# Patient Record
Sex: Female | Born: 1970 | Race: White | Hispanic: No | Smoking: Current every day smoker
Health system: Southern US, Community
[De-identification: ages and names within clinical notes are randomized; demographics above are authoritative.]

## PROBLEM LIST (undated history)

## (undated) DIAGNOSIS — R569 Unspecified convulsions: Secondary | ICD-10-CM

## (undated) DIAGNOSIS — I10 Essential (primary) hypertension: Secondary | ICD-10-CM

## (undated) DIAGNOSIS — Z8489 Family history of other specified conditions: Secondary | ICD-10-CM

## (undated) DIAGNOSIS — E119 Type 2 diabetes mellitus without complications: Secondary | ICD-10-CM

## (undated) DIAGNOSIS — G43909 Migraine, unspecified, not intractable, without status migrainosus: Secondary | ICD-10-CM

## (undated) DIAGNOSIS — F329 Major depressive disorder, single episode, unspecified: Secondary | ICD-10-CM

## (undated) DIAGNOSIS — F32A Depression, unspecified: Secondary | ICD-10-CM

## (undated) DIAGNOSIS — E039 Hypothyroidism, unspecified: Secondary | ICD-10-CM

## (undated) DIAGNOSIS — J410 Simple chronic bronchitis: Secondary | ICD-10-CM

## (undated) DIAGNOSIS — E079 Disorder of thyroid, unspecified: Secondary | ICD-10-CM

## (undated) HISTORY — PX: ABDOMINAL HYSTERECTOMY: SHX81

## (undated) HISTORY — PX: NOSE SURGERY: SHX723

---

## 2004-10-05 ENCOUNTER — Ambulatory Visit: Payer: Self-pay | Admitting: Family Medicine

## 2004-10-19 ENCOUNTER — Other Ambulatory Visit: Payer: Self-pay

## 2004-11-19 ENCOUNTER — Ambulatory Visit: Payer: Self-pay | Admitting: Unknown Physician Specialty

## 2005-01-07 ENCOUNTER — Ambulatory Visit: Payer: Self-pay | Admitting: Unknown Physician Specialty

## 2008-03-08 ENCOUNTER — Emergency Department: Payer: Self-pay | Admitting: Internal Medicine

## 2008-07-31 ENCOUNTER — Ambulatory Visit: Payer: Self-pay | Admitting: General Surgery

## 2008-08-06 ENCOUNTER — Ambulatory Visit: Payer: Self-pay | Admitting: General Surgery

## 2009-12-29 ENCOUNTER — Ambulatory Visit: Payer: Self-pay | Admitting: Family Medicine

## 2010-01-08 ENCOUNTER — Ambulatory Visit: Payer: Self-pay | Admitting: Family Medicine

## 2010-08-18 ENCOUNTER — Ambulatory Visit: Payer: Self-pay | Admitting: Family Medicine

## 2010-09-02 ENCOUNTER — Ambulatory Visit: Payer: Self-pay | Admitting: Family Medicine

## 2012-11-12 ENCOUNTER — Inpatient Hospital Stay: Payer: Self-pay | Admitting: Internal Medicine

## 2012-11-12 LAB — COMPREHENSIVE METABOLIC PANEL
Albumin: 3.7 g/dL (ref 3.4–5.0)
Alkaline Phosphatase: 98 U/L (ref 50–136)
Anion Gap: 6 — ABNORMAL LOW (ref 7–16)
BUN: 12 mg/dL (ref 7–18)
Bilirubin,Total: 0.5 mg/dL (ref 0.2–1.0)
Calcium, Total: 9.5 mg/dL (ref 8.5–10.1)
Chloride: 100 mmol/L (ref 98–107)
Co2: 27 mmol/L (ref 21–32)
Creatinine: 0.98 mg/dL (ref 0.60–1.30)
EGFR (African American): >60
EGFR (Non-African Amer.): >60
Glucose: 175 mg/dL — ABNORMAL HIGH (ref 65–99)
Osmolality: 270 (ref 275–301)
Potassium: 3.6 mmol/L (ref 3.5–5.1)
SGOT(AST): 21 U/L (ref 15–37)
SGPT (ALT): 27 U/L (ref 12–78)
Sodium: 133 mmol/L — ABNORMAL LOW (ref 136–145)
Total Protein: 8.4 g/dL — ABNORMAL HIGH (ref 6.4–8.2)

## 2012-11-12 LAB — URINALYSIS, COMPLETE
Bacteria: NONE SEEN
Bilirubin,UR: NEGATIVE
Blood: NEGATIVE
Glucose,UR: NEGATIVE mg/dL (ref 0–75)
Ketone: NEGATIVE
Leukocyte Esterase: NEGATIVE
Nitrite: NEGATIVE
Ph: 5 (ref 4.5–8.0)
Protein: 100
RBC,UR: <1 /HPF (ref 0–5)
Specific Gravity: 1.017 (ref 1.003–1.030)
Squamous Epithelial: 1
WBC UR: 2 /HPF (ref 0–5)

## 2012-11-12 LAB — CBC WITH DIFFERENTIAL/PLATELET
Basophil #: 0 10*3/uL (ref 0.0–0.1)
Basophil %: 0.2 %
Eosinophil #: 0 10*3/uL (ref 0.0–0.7)
Eosinophil %: 0 %
HCT: 46.5 % (ref 35.0–47.0)
HGB: 16.2 g/dL — ABNORMAL HIGH (ref 12.0–16.0)
Lymphocyte #: 0.5 10*3/uL — ABNORMAL LOW (ref 1.0–3.6)
Lymphocyte %: 2.6 %
MCH: 30.4 pg (ref 26.0–34.0)
MCHC: 34.9 g/dL (ref 32.0–36.0)
MCV: 87 fL (ref 80–100)
Monocyte #: 0.7 x10 3/mm (ref 0.2–0.9)
Monocyte %: 3.4 %
Neutrophil #: 18.2 10*3/uL — ABNORMAL HIGH (ref 1.4–6.5)
Neutrophil %: 93.8 %
Platelet: 205 10*3/uL (ref 150–440)
RBC: 5.34 10*6/uL — ABNORMAL HIGH (ref 3.80–5.20)
RDW: 13.6 % (ref 11.5–14.5)
WBC: 19.4 10*3/uL — ABNORMAL HIGH (ref 3.6–11.0)

## 2012-11-12 LAB — PREGNANCY, URINE: Pregnancy Test, Urine: NEGATIVE m[IU]/mL

## 2012-11-12 LAB — PROTIME-INR
INR: 1
Prothrombin Time: 13.5 secs (ref 11.5–14.7)

## 2012-11-12 LAB — RAPID INFLUENZA A&B ANTIGENS

## 2012-11-13 LAB — BASIC METABOLIC PANEL
Anion Gap: 0 — ABNORMAL LOW (ref 7–16)
BUN: 9 mg/dL (ref 7–18)
Calcium, Total: 8.3 mg/dL — ABNORMAL LOW (ref 8.5–10.1)
Chloride: 103 mmol/L (ref 98–107)
Co2: 32 mmol/L (ref 21–32)
Creatinine: 1.03 mg/dL (ref 0.60–1.30)
EGFR (African American): >60
EGFR (Non-African Amer.): >60
Glucose: 149 mg/dL — ABNORMAL HIGH (ref 65–99)
Osmolality: 272 (ref 275–301)
Potassium: 3.2 mmol/L — ABNORMAL LOW (ref 3.5–5.1)
Sodium: 135 mmol/L — ABNORMAL LOW (ref 136–145)

## 2012-11-13 LAB — CBC WITH DIFFERENTIAL/PLATELET
Basophil #: 0 10*3/uL (ref 0.0–0.1)
Basophil %: 0.2 %
Eosinophil #: 0 10*3/uL (ref 0.0–0.7)
Eosinophil %: 0 %
HCT: 41.2 % (ref 35.0–47.0)
HGB: 14.4 g/dL (ref 12.0–16.0)
Lymphocyte #: 0.5 10*3/uL — ABNORMAL LOW (ref 1.0–3.6)
Lymphocyte %: 3.6 %
MCH: 30.9 pg (ref 26.0–34.0)
MCHC: 35 g/dL (ref 32.0–36.0)
MCV: 88 fL (ref 80–100)
Monocyte #: 0.5 x10 3/mm (ref 0.2–0.9)
Monocyte %: 3.3 %
Neutrophil #: 14 10*3/uL — ABNORMAL HIGH (ref 1.4–6.5)
Neutrophil %: 92.9 %
Platelet: 172 10*3/uL (ref 150–440)
RBC: 4.66 10*6/uL (ref 3.80–5.20)
RDW: 13.6 % (ref 11.5–14.5)
WBC: 15.1 10*3/uL — ABNORMAL HIGH (ref 3.6–11.0)

## 2012-11-13 LAB — URINE CULTURE

## 2012-11-13 LAB — LIPID PANEL
Cholesterol: 127 mg/dL (ref 0–200)
HDL Cholesterol: 38 mg/dL — ABNORMAL LOW (ref 40–60)
Ldl Cholesterol, Calc: 70 mg/dL (ref 0–100)
Triglycerides: 95 mg/dL (ref 0–200)
VLDL Cholesterol, Calc: 19 mg/dL (ref 5–40)

## 2012-11-13 LAB — HEMOGLOBIN A1C: Hemoglobin A1C: 6.3 % (ref 4.2–6.3)

## 2012-11-13 LAB — MAGNESIUM: Magnesium: 1.3 mg/dL — ABNORMAL LOW

## 2012-11-13 LAB — TSH: Thyroid Stimulating Horm: 0.552 u[IU]/mL

## 2012-11-14 LAB — CBC WITH DIFFERENTIAL/PLATELET
Basophil #: 0 10*3/uL (ref 0.0–0.1)
Basophil %: 0.3 %
Eosinophil #: 0 10*3/uL (ref 0.0–0.7)
Eosinophil %: 0.1 %
HCT: 40.8 % (ref 35.0–47.0)
HGB: 14 g/dL (ref 12.0–16.0)
Lymphocyte #: 1.1 10*3/uL (ref 1.0–3.6)
Lymphocyte %: 7.2 %
MCH: 30.7 pg (ref 26.0–34.0)
MCHC: 34.4 g/dL (ref 32.0–36.0)
MCV: 89 fL (ref 80–100)
Monocyte #: 0.7 x10 3/mm (ref 0.2–0.9)
Monocyte %: 4.7 %
Neutrophil #: 13.9 10*3/uL — ABNORMAL HIGH (ref 1.4–6.5)
Neutrophil %: 87.7 %
Platelet: 175 10*3/uL (ref 150–440)
RBC: 4.57 10*6/uL (ref 3.80–5.20)
RDW: 13.8 % (ref 11.5–14.5)
WBC: 15.8 10*3/uL — ABNORMAL HIGH (ref 3.6–11.0)

## 2012-11-14 LAB — BASIC METABOLIC PANEL
Anion Gap: 0 — ABNORMAL LOW (ref 7–16)
BUN: 9 mg/dL (ref 7–18)
Calcium, Total: 8.5 mg/dL (ref 8.5–10.1)
Chloride: 105 mmol/L (ref 98–107)
Co2: 32 mmol/L (ref 21–32)
Creatinine: 1.19 mg/dL (ref 0.60–1.30)
EGFR (African American): >60
EGFR (Non-African Amer.): 56 — ABNORMAL LOW
Glucose: 122 mg/dL — ABNORMAL HIGH (ref 65–99)
Osmolality: 274 (ref 275–301)
Potassium: 4.3 mmol/L (ref 3.5–5.1)
Sodium: 137 mmol/L (ref 136–145)

## 2012-11-14 LAB — VANCOMYCIN, TROUGH: Vancomycin, Trough: 10 ug/mL (ref 10–20)

## 2012-11-14 LAB — MAGNESIUM: Magnesium: 1.8 mg/dL

## 2012-11-16 LAB — BASIC METABOLIC PANEL
Anion Gap: 4 — ABNORMAL LOW (ref 7–16)
BUN: 9 mg/dL (ref 7–18)
Calcium, Total: 8.6 mg/dL (ref 8.5–10.1)
Chloride: 107 mmol/L (ref 98–107)
Co2: 28 mmol/L (ref 21–32)
Creatinine: 0.8 mg/dL (ref 0.60–1.30)
EGFR (African American): >60
EGFR (Non-African Amer.): >60
Glucose: 133 mg/dL — ABNORMAL HIGH (ref 65–99)
Osmolality: 278 (ref 275–301)
Potassium: 3.7 mmol/L (ref 3.5–5.1)
Sodium: 139 mmol/L (ref 136–145)

## 2012-11-16 LAB — CBC WITH DIFFERENTIAL/PLATELET
Basophil #: 0 10*3/uL (ref 0.0–0.1)
Basophil %: 0.3 %
Eosinophil #: 0 10*3/uL (ref 0.0–0.7)
Eosinophil %: 0.1 %
HCT: 35.4 % (ref 35.0–47.0)
HGB: 12.2 g/dL (ref 12.0–16.0)
Lymphocyte #: 1 10*3/uL (ref 1.0–3.6)
Lymphocyte %: 12.8 %
MCH: 30.5 pg (ref 26.0–34.0)
MCHC: 34.6 g/dL (ref 32.0–36.0)
MCV: 88 fL (ref 80–100)
Monocyte #: 0.6 x10 3/mm (ref 0.2–0.9)
Monocyte %: 8.1 %
Neutrophil #: 5.9 10*3/uL (ref 1.4–6.5)
Neutrophil %: 78.7 %
Platelet: 198 10*3/uL (ref 150–440)
RBC: 4.01 10*6/uL (ref 3.80–5.20)
RDW: 14 % (ref 11.5–14.5)
WBC: 7.5 10*3/uL (ref 3.6–11.0)

## 2012-11-17 LAB — CBC WITH DIFFERENTIAL/PLATELET
Basophil #: 0 10*3/uL (ref 0.0–0.1)
Basophil %: 0.6 %
Eosinophil #: 0 10*3/uL (ref 0.0–0.7)
Eosinophil %: 0.2 %
HCT: 37.1 % (ref 35.0–47.0)
HGB: 12.7 g/dL (ref 12.0–16.0)
Lymphocyte #: 1.2 10*3/uL (ref 1.0–3.6)
Lymphocyte %: 15.6 %
MCH: 30.4 pg (ref 26.0–34.0)
MCHC: 34.3 g/dL (ref 32.0–36.0)
MCV: 89 fL (ref 80–100)
Monocyte #: 0.7 x10 3/mm (ref 0.2–0.9)
Monocyte %: 9.7 %
Neutrophil #: 5.7 10*3/uL (ref 1.4–6.5)
Neutrophil %: 73.9 %
Platelet: 254 10*3/uL (ref 150–440)
RBC: 4.18 10*6/uL (ref 3.80–5.20)
RDW: 13.9 % (ref 11.5–14.5)
WBC: 7.7 10*3/uL (ref 3.6–11.0)

## 2012-11-17 LAB — BASIC METABOLIC PANEL
Anion Gap: 4 — ABNORMAL LOW (ref 7–16)
BUN: 10 mg/dL (ref 7–18)
Calcium, Total: 8.3 mg/dL — ABNORMAL LOW (ref 8.5–10.1)
Chloride: 105 mmol/L (ref 98–107)
Co2: 29 mmol/L (ref 21–32)
Creatinine: 0.91 mg/dL (ref 0.60–1.30)
EGFR (African American): >60
EGFR (Non-African Amer.): >60
Glucose: 118 mg/dL — ABNORMAL HIGH (ref 65–99)
Osmolality: 276 (ref 275–301)
Potassium: 4.1 mmol/L (ref 3.5–5.1)
Sodium: 138 mmol/L (ref 136–145)

## 2012-11-17 LAB — CULTURE, BLOOD (SINGLE)

## 2012-11-19 LAB — BASIC METABOLIC PANEL
Anion Gap: 3 — ABNORMAL LOW (ref 7–16)
BUN: 12 mg/dL (ref 7–18)
Calcium, Total: 8.7 mg/dL (ref 8.5–10.1)
Chloride: 107 mmol/L (ref 98–107)
Co2: 28 mmol/L (ref 21–32)
Creatinine: 0.89 mg/dL (ref 0.60–1.30)
EGFR (African American): >60
EGFR (Non-African Amer.): >60
Glucose: 149 mg/dL — ABNORMAL HIGH (ref 65–99)
Osmolality: 278 (ref 275–301)
Potassium: 4 mmol/L (ref 3.5–5.1)
Sodium: 138 mmol/L (ref 136–145)

## 2012-11-19 LAB — CBC WITH DIFFERENTIAL/PLATELET
Basophil #: 0.1 10*3/uL (ref 0.0–0.1)
Basophil %: 0.7 %
Eosinophil #: 0 10*3/uL (ref 0.0–0.7)
Eosinophil %: 0.1 %
HCT: 32.4 % — ABNORMAL LOW (ref 35.0–47.0)
HGB: 11.3 g/dL — ABNORMAL LOW (ref 12.0–16.0)
Lymphocyte #: 1 10*3/uL (ref 1.0–3.6)
Lymphocyte %: 10.8 %
MCH: 30.9 pg (ref 26.0–34.0)
MCHC: 35 g/dL (ref 32.0–36.0)
MCV: 88 fL (ref 80–100)
Monocyte #: 0.6 x10 3/mm (ref 0.2–0.9)
Monocyte %: 5.8 %
Neutrophil #: 7.8 10*3/uL — ABNORMAL HIGH (ref 1.4–6.5)
Neutrophil %: 82.6 %
Platelet: 292 10*3/uL (ref 150–440)
RBC: 3.67 10*6/uL — ABNORMAL LOW (ref 3.80–5.20)
RDW: 13.8 % (ref 11.5–14.5)
WBC: 9.5 10*3/uL (ref 3.6–11.0)

## 2012-11-19 LAB — CULTURE, BLOOD (SINGLE)

## 2012-11-22 ENCOUNTER — Inpatient Hospital Stay: Payer: Self-pay | Admitting: Internal Medicine

## 2012-11-22 LAB — CBC
HCT: 38.4 % (ref 35.0–47.0)
HGB: 13 g/dL (ref 12.0–16.0)
MCH: 30 pg (ref 26.0–34.0)
MCHC: 33.9 g/dL (ref 32.0–36.0)
MCV: 88 fL (ref 80–100)
Platelet: 417 10*3/uL (ref 150–440)
RBC: 4.34 10*6/uL (ref 3.80–5.20)
RDW: 14 % (ref 11.5–14.5)
WBC: 10.3 10*3/uL (ref 3.6–11.0)

## 2012-11-22 LAB — COMPREHENSIVE METABOLIC PANEL
Albumin: 2.6 g/dL — ABNORMAL LOW (ref 3.4–5.0)
Alkaline Phosphatase: 79 U/L (ref 50–136)
Anion Gap: 3 — ABNORMAL LOW (ref 7–16)
BUN: 15 mg/dL (ref 7–18)
Bilirubin,Total: 0.3 mg/dL (ref 0.2–1.0)
Calcium, Total: 9.4 mg/dL (ref 8.5–10.1)
Chloride: 106 mmol/L (ref 98–107)
Co2: 28 mmol/L (ref 21–32)
Creatinine: 0.9 mg/dL (ref 0.60–1.30)
EGFR (African American): >60
EGFR (Non-African Amer.): >60
Glucose: 144 mg/dL — ABNORMAL HIGH (ref 65–99)
Osmolality: 277 (ref 275–301)
Potassium: 4.1 mmol/L (ref 3.5–5.1)
SGOT(AST): 27 U/L (ref 15–37)
SGPT (ALT): 34 U/L (ref 12–78)
Sodium: 137 mmol/L (ref 136–145)
Total Protein: 7.9 g/dL (ref 6.4–8.2)

## 2012-11-22 LAB — HEMOGLOBIN A1C: Hemoglobin A1C: 6.8 % — ABNORMAL HIGH (ref 4.2–6.3)

## 2012-11-23 LAB — COMPREHENSIVE METABOLIC PANEL
Albumin: 2.3 g/dL — ABNORMAL LOW (ref 3.4–5.0)
Alkaline Phosphatase: 81 U/L (ref 50–136)
Anion Gap: 5 — ABNORMAL LOW (ref 7–16)
BUN: 13 mg/dL (ref 7–18)
Bilirubin,Total: 0.5 mg/dL (ref 0.2–1.0)
Calcium, Total: 9.2 mg/dL (ref 8.5–10.1)
Chloride: 103 mmol/L (ref 98–107)
Co2: 31 mmol/L (ref 21–32)
Creatinine: 1.03 mg/dL (ref 0.60–1.30)
EGFR (African American): >60
EGFR (Non-African Amer.): >60
Glucose: 124 mg/dL — ABNORMAL HIGH (ref 65–99)
Osmolality: 279 (ref 275–301)
Potassium: 4 mmol/L (ref 3.5–5.1)
SGOT(AST): 16 U/L (ref 15–37)
SGPT (ALT): 31 U/L (ref 12–78)
Sodium: 139 mmol/L (ref 136–145)
Total Protein: 7.4 g/dL (ref 6.4–8.2)

## 2012-11-23 LAB — CBC WITH DIFFERENTIAL/PLATELET
Basophil #: 0 10*3/uL (ref 0.0–0.1)
Basophil %: 0.3 %
Eosinophil #: 0 10*3/uL (ref 0.0–0.7)
Eosinophil %: 0.1 %
HCT: 33.5 % — ABNORMAL LOW (ref 35.0–47.0)
HGB: 11.5 g/dL — ABNORMAL LOW (ref 12.0–16.0)
Lymphocyte #: 1.4 10*3/uL (ref 1.0–3.6)
Lymphocyte %: 14.2 %
MCH: 30.5 pg (ref 26.0–34.0)
MCHC: 34.3 g/dL (ref 32.0–36.0)
MCV: 89 fL (ref 80–100)
Monocyte #: 0.7 x10 3/mm (ref 0.2–0.9)
Monocyte %: 7.1 %
Neutrophil #: 7.9 10*3/uL — ABNORMAL HIGH (ref 1.4–6.5)
Neutrophil %: 78.3 %
Platelet: 434 10*3/uL (ref 150–440)
RBC: 3.77 10*6/uL — ABNORMAL LOW (ref 3.80–5.20)
RDW: 13.6 % (ref 11.5–14.5)
WBC: 10.2 10*3/uL (ref 3.6–11.0)

## 2012-11-24 LAB — VANCOMYCIN, TROUGH: Vancomycin, Trough: 12 ug/mL (ref 10–20)

## 2012-11-26 LAB — WOUND AEROBIC CULTURE

## 2012-11-27 LAB — CULTURE, BLOOD (SINGLE)

## 2012-11-27 LAB — PLATELET COUNT: Platelet: 471 10*3/uL — ABNORMAL HIGH (ref 150–440)

## 2012-11-27 LAB — CREATININE, SERUM
Creatinine: 1.11 mg/dL (ref 0.60–1.30)
EGFR (African American): >60
EGFR (Non-African Amer.): >60

## 2014-05-03 NOTE — Discharge Summary (Signed)
PATIENT NAME:  Wendy [name], Wendy [name] MR#:  [id number] DATE OF BIRTH:  1970-04-17  DATE OF ADMISSION:  11/22/2012 DATE OF DISCHARGE:  11/28/2012  ADMITTING PHYSICIAN:  Theodoro Grist, MD    DISCHARGING PHYSICIAN:  Gladstone Lighter, MD  PRIMARY CARE PHYSICIAN: Dr. Jimmye Norman at [facility].   [place]: Surgical consultation with Dr. Burt Knack and Dr. Pat Patrick.   DISCHARGE DIAGNOSES: 1.  Right leg cellulitis with posterior leg eschar formation.  2.  Hypertension:  3.  Hypothyroidism.   4.  Depression.    DISCHARGE HOME MEDICATIONS:  1.  Effexor 75 mg p.o. daily.  2.  Levothyroxine 88 mcg p.o. daily.  3.  B complex 1 tablet p.o. daily.  4.  Vitamin D3 2000 international units p.o. daily.  5.  Lactobacillus capsule 1 capsule p.o. daily.  6.  HCTZ 25 mg p.o. daily.  7.  Percocet 10/325 mg 1 tablet p.o. q.6 hours p.r.n. for pain.  8.  Topical bacitracin applied topically to affected area b.i.d.   9.  Collagenase 250 units per gram topical ointment, apply topically to affected area once a day.  11.  Clindamycin 300 mg 1 capsule p.o. 3 times a day for 8 more days.   DISCHARGE DIET: Low-sodium diet.   DISCHARGE ACTIVITY: As tolerated.    FOLLOWUP INSTRUCTIONS: 1.  Advised to keep the right leg elevated for most of the time.  2.  Follow up with [facility] in 1 week and replace dressing as necessary. Please pace a nonadherent dressing to the right calf and change it every day or as needed.  3.  PCP followup in 2 weeks.   LABORATORY AND IMAGING STUDIES PRIOR TO DISCHARGE:  1.  WBC 10.2, hemoglobin 11.5, hematocrit 33.5, platelet count 434.  2.  Sodium 139, potassium 4.0, chloride 103, bicarb 31, BUN 13, creatinine 1.03, glucose 124 and calcium of 9.2.  3.  ALT 31, AST 16, alk phos 81, total bili 0.5, albumin of 3.3.  4.  Ultrasound Doppler of the right leg showing negative for DVT, prominent right inguinal lymph nodes noted likely from likely her right leg cellulitis.   5.  Blood cultures on admission negative. Coagulase-negative staph growing from the wound cultures taken from her right leg.   BRIEF HOSPITAL COURSE: [name] is a 44 year old obese Caucasian female with past medical history significant for hypertension and hypothyroidism, who had a recent hospital course from 11/12/2012 through 11/20/2012 for right leg cellulitis with eschar formation. On November 10th, patient was discharged because she had some personal issues to attend to and wanted to go home on oral antibiotics. She was discharged on Augmentin and doxycycline; however, came back on 11/22/2012 due to nonimproving cellulitis with oral antibiotics.  1.  Right leg cellulitis going on for almost 2 weeks now, initially admitted on November 2nd and  discharged on November 10th on oral antibiotics and comes back again within 2 days with  failure to antibiotics. She was seen by surgery this admission. Her swelling has improved a lot. She was advised to keep her leg elevated. She was started on vanc and Zosyn while in the hospital. Wound cultures were growing negative. She has sloughing of skin with black eschar on the back of the right leg with improved erythema on the front. Surgery advised to follow up as an outpatient in 1 week and see if they could do a superficial debridement.  For now, antibiotics were advised so she is being discharged on clindamycin as  SHE HAS SULFA ALLERGY. She will follow up with surgery as an outpatient. Dressing changes were explained to the patient at the time of discharge. She is also advised to use collagenase for skin healing and also bacitracin antiseptic for her wound topically. She is on Percocet for pain as needed.  2.  Hypothyroidism. She is on Synthroid.  3.  Depression. Continue Effexor.  4.  Hypertension. Continue home medications. The patient on HCTZ at home.  4.  Her course has been otherwise uneventful in the hospital.   DISCHARGE CONDITION: Stable.    DISCHARGE DISPOSITION: Home.   TIME SPENT ON DISCHARGE: 40 minutes.   ____________________________ Gladstone Lighter, MD rk:cs D: 11/28/2012 14:09:00 ET T: 11/28/2012 15:49:48 ET JOB#: [id number]  cc: Gladstone Lighter, MD, <Dictator> Myrle Sheng. Jimmye Norman, MD Gladstone Lighter MD ELECTRONICALLY SIGNED 12/05/2012 18:16

## 2014-05-03 NOTE — Consult Note (Signed)
Brief Consult Note: Diagnosis: RLE resolving cellulitis.   Patient was seen by consultant.   Consult note dictated.   Recommend further assessment or treatment.   Orders entered.   Comments: resolving/improving cellulitis without surgical indications for I&D etc. Rec silvadene BID and continueIV abx will follow.  Electronic Signatures: Lattie Hawooper, Terran Hollenkamp E (MD)  (Signed [phone]) [id number]-Nov-14 21:10)  Authored: Brief Consult Note   Last Updated: 12-Nov-14 21:10 by Lattie Hawooper, Gaynor Ferreras E (MD)

## 2014-05-03 NOTE — Consult Note (Signed)
PATIENT NAME:  Wendy [name], [name] MR#:  [id number] DATE OF BIRTH:  [birth year]  DATE OF CONSULTATION:  11/13/2012  CONSULTING PHYSICIAN:  Cristal Deerhristopher A. Romuald Mccaslin, MD  REASON FOR CONSULTATION: Fevers, right leg erythema, and tenderness.   HISTORY OF PRESENT ILLNESS: Wendy [name] is a pleasant 44 year old female with history of tobacco use and diabetes who presented to the ED with fevers to 104. She said that over the past previous days, she was having a migraine headache and decided to take her temperature and it was 104. At this time, her mother, who is here with her, noticed that she had redness around her ankle and she had some redness, which has extended partially around her leg. She also was tachycardic at that time and nauseated and does still continue to have the headache. No chills, night sweats, shortness of breath, cough, chest pain, abdominal pain, nausea, diarrhea, constipation, dysuria or hematuria.   PAST MEDICAL HISTORY: Hypertension, diabetes, anxiety, history of hysterectomy.   FAMILY HISTORY: Parents with DVT, history of cancer, history of hypertension, history of coronary artery disease, history of diabetes.   SOCIAL HISTORY: Smokes 1 to 2 packs a day since 44 years old. Denies alcohol or drugs.   REVIEW OF SYSTEMS: A 12-point review of systems was obtained. Pertinent positives and negatives as above.   PHYSICAL EXAMINATION:  VITAL SIGNS: Temperature 103, pulse 118, blood pressure 108/61, respirations 18, and is on nasal cannula.  GENERAL: No acute distress. Alert and oriented x3.  HEAD: Normocephalic, atraumatic.  EYES: No scleral icterus. No conjunctivitis.  FACE: No obvious facial trauma. Normal external nose. Normal external ears.  CHEST: Lungs clear to auscultation. Moving air well.  HEART: Regular rate and rhythm. No murmurs, rubs, or gallops.  ABDOMEN: Soft, nontender, nondistended.  EXTREMITIES: Right lower extremity with medial cellulitis with no obvious drainable  fluid collection or abscess. No obvious skin changes. Minimal edema. Also has two punctate areas on her medial thigh which are red as well but not with edema. Otherwise moves all extremities well.  NEUROLOGIC: Cranial nerves II through XII grossly intact. Sensation intact in all 4 extremities.   LABORATORY AND RADIOLOGICAL DATA: Significant for current white blood cell count of 15.1. was 19.4 at admission, 93% neutrophils. Hemoglobin, hematocrit, and platelets are normal. BMP is significant for potassium of 3.2, magnesium of 1.3. LFTs are normal.   IMAGING: Ultrasound left lower extremity shows no DVT, no obvious drainable fluid collection.   ASSESSMENT AND PLAN: Wendy [name] is a pleasant 44 year old female with right lower-extremity cellulitis. I see no obvious indications for surgery. No drainable fluid collections, not consistent with necrotizing soft-tissue infection. White cell count is improving. We will obtain a plain film x-ray to ensure no subcutaneous emphysema, but this, I think, would be very unlikely based on this presentation. Would continue IV antibiotics and continue to assess for possible abscess formation. I have discussed this with the patient and she agrees with the plan.    ____________________________ Si Raiderhristopher A. Tally Mattox, MD cal:np D: 11/13/2012 17:05:00 ET T: 11/13/2012 18:08:53 ET JOB#: [id number]  cc: Cristal Deerhristopher A. Zharia Conrow, MD, <Dictator> Jarvis NewcomerHRISTOPHER A Tirsa Gail MD ELECTRONICALLY SIGNED 11/15/2012 15:20

## 2014-05-03 NOTE — Consult Note (Signed)
PATIENT NAME:  Wendy [name], Wendy [name] MR#:  [id number] DATE OF BIRTH:  1970/03/29  DATE OF CONSULTATION:  11/22/2012  CONSMerleen NicelyULTING PHYSICIAN:  Adah Salvageichard E. Excell Seltzerooper, MD  CHIEF COMPLAINT: Right lower extremity cellulitis.   HISTORY OF PRESENT ILLNESS: This is [name] patient who was just discharged from the hospital after [name] 2 week stay 3 days ago. She was on IV antibiotics in the hospital and was sent home on oral antibiotics. She returns with worsening pain, low-grade fever and increasing redness of the right lower extremity. She had [name] diagnosis of cellulitis.   Of note, she was seen by Dr. Juliann PulseLundquist in consultation for surgery on the 3rd of November. See his note as well.   The patient states that she feels much better today. She has been on antibiotics now for [name] couple of doses. She can move her ankle and her toes without difficulty, and her pain is much improved.   PAST MEDICAL HISTORY: Gestational diabetes, hypertension, morbid obesity and tobacco abuse.   PAST SURGICAL HISTORY: Hysterectomy.   SOCIAL HISTORY: The patient stopped smoking 2 weeks ago. Does not drink alcohol.   FAMILY HISTORY: Noncontributory.   REVIEW OF SYSTEMS:  Ten-system review has been performed and negative with the exception of that mentioned in the HPI.   PHYSICAL EXAMINATION: VITAL SIGNS: Temp of 99.5. Morbidly obese with [name] BMI of 47. Pulse of 82, respirations 20, blood pressure 133/65.  HEENT: Shows morbid obesity.  NECK: No palpable nodes.  CHEST: Clear to auscultation.  CARDIAC: Regular rate and rhythm.  ABDOMEN: Soft, nontender, but obese.  EXTREMITIES: Obese and edematous. The right lower extremity is erythematous, but the erythema is fading, and with respect to prior ink markings from the prior hospitalization, the erythema has decreased. There are no active bullae. There is superficial exfoliation of the skin, or desquamation, and full range of motion with edema.  NEUROLOGIC: Grossly intact.   LABORATORY VALUES:  Demonstrate [name] normal white blood cell count.   An ultrasound shows no sign of DVT.   ASSESSMENT AND PLAN: This is [name] patient with resolving cellulitis. Her pain is improved. She is afebrile with [name] normal white blood cell count. I would continue IV antibiotics. There is no sign of necrotizing soft tissue infection in this patient. I will be happy to follow the patient while she is in the hospital. I have taken the liberty of ordering q.12 Silvadene dressing to help the desquamation.    ____________________________ Adah Salvageichard E. Excell Seltzerooper, MD rec:dmm D: 11/22/2012 21:23:18 ET T: 11/22/2012 21:39:15 ET JOB#: [id number]  cc: Adah Salvageichard E. Excell Seltzerooper, MD, <Dictator> Lattie HawICHARD E Harkirat Orozco MD ELECTRONICALLY SIGNED 11/23/2012 6:49

## 2014-05-03 NOTE — H&P (Signed)
PATIENT NAME:  Wendy [name], Wendy [name] MR#:  [id number] DATE OF BIRTH:  [birth year]  DATE OF ADMISSION:  11/12/2012  PRIMARY CARE PHYSICIAN:  Dr. Ulanda EdisonEmma Williams   REFERRING PHYSICIAN: Dr. Mayford KnifeWilliams  CHIEF COMPLAINT:  Fever, chills, right leg redness and tenderness since yesterday.   HISTORY OF PRESENT ILLNESS: [name] 44 year old Caucasian female with [name] history of hypertension, diabetes, tobacco abuse, anxiety. Presented to the ED with above complains. The patient is alert, awake, oriented, in no acute distress. The patient started to have [name] fever and chills since yesterday. Noticed had right leg tenderness and redness. In addition, patient had [name] fever, with [name] temperature of 104. Tachycardia, 120. The patient feels sick, with nausea, but denies any headache or dizziness. No cough, sputum, shortness of breath. No chest pain, palpitations.   PAST MEDICAL HISTORY: Hypertension, diabetes, anxiety, obesity.   SURGICAL HISTORY: Hysterectomy.   SOCIAL HISTORY: Smokes 1-1/2 packs [name] day since 44 years old. Denies any alcohol drinking or illicit drugs.   FAMILY HISTORY: Both parents had DVT.  REVIEW OF SYSTEMS: CONSTITUTIONAL: The patient has fever, chills, but no headache or dizziness. Has weakness and [name] decreased appetite.  EYES: No double vision or blurry vision.   ENT: No postnasal drip, slurred speech, or dysphagia.  CARDIOVASCULAR: No chest pain, palpitation, orthopnea, or nocturnal dyspnea. No leg edema.  PULMONARY: No cough, sputum, shortness of breath or hemoptysis.  GASTROINTESTINAL: No abdominal pain, nausea, vomiting or diarrhea. No melena or bloody stools.  GENITOURINARY: No dysuria, hematuria, or incontinence.  SKIN: No rash or jaundice.  NEUROLOGIC: No syncope, loss of consciousness, or seizure.  ENDOCRINE: No polyuria, polydipsia, heat or cold intolerance.  HEMATOLOGIC: No easy bruising or bleeding.   VITAL SIGNS: Temperature 102.6, blood pressure 140/62, pulse 110, respirations 20, O2  saturation 94% in room air.   PHYSICAL EXAMINATION:  GENERAL: The patient is alert, awake, oriented, in no acute distress.  HEENT: Pupils round, equal, react to light and accommodation. No discharge from ears or nose. Dry oral mucosa. Clear oropharynx. Facial redness.  NECK: Supple. No JVD or carotid bruits. No lymphadenopathy. No thyromegaly.  CARDIOVASCULAR: S1, S2. Regular rate and rhythm. No murmurs or gallop. Tachycardia.  PULMONARY: Bilateral air entry. No wheezing or rales. No use of accessory muscles to breathe.  ABDOMEN: Soft. No distention or tenderness. No organomegaly. Bowel sounds present. Obesity.  EXTREMITIES: No edema, clubbing or cyanosis. No calf tenderness, but has erythema and tenderness on the right lower extremity under knee down to right ankle.  NEUROLOGIC: Alert and oriented x 3. No focal deficit. Power 5/5. Sensation intact.   LABORATORY DATA:  Duplex of the right leg did not show any DVT. Urinalysis is negative. Pregnancy test negative. Influenza [name] and B negative. WBC 19.4, hemoglobin 16.2, platelets 205. Glucose 175, BUN 12, creatinine 0.98, sodium 133, potassium 3.6, chloride 100. INR 1.0. Chest x-ray: No acute cardiopulmonary disease.   IMPRESSIONS: 1.  Sepsis due to right leg cellulitis.  2.  Right lower extremity cellulitis.  3.  Hyponatremia.  4.  Hypertension.  5.  Diabetes.  6.  Obesity.  7.  Tobacco abuse.   PLAN OF TREATMENT:   1.  The patient will be admitted to [name] medical floor. We will start vancomycin and Zosyn, and follow up blood culture, CBC.  2.  For hyponatremia, will start normal saline IV and follow up BMP.  3.  For diabetes, we will start sliding scale, check hemoglobin A1c.   4.  For hypertension, we  will hold HCTZ due to hyponatremia and dehydration. Follow blood pressure.  5.  Smoking cessation, was counseled for 5 minutes.   Patient status is FULL CODE.  TIME SPENT: About 52 minutes.   I discussed the patient's condition and plan of  treatment with the patient and the patient's mother.     ____________________________ Shaune Pollack, MD qc:mr D: 11/12/2012 16:35:00 ET T: 11/12/2012 18:29:31 ET JOB#: [id number]  cc: Shaune Pollack, MD, <Dictator> Shaune Pollack MD ELECTRONICALLY SIGNED 11/14/2012 11:32

## 2014-05-03 NOTE — Discharge Summary (Signed)
PATIENT NAME:  Wendy [name], Cedra A MR#:  [id number] DATE OF BIRTH:  September 16, 1970  DATE OF ADMISSION:  11/12/2012 DATE OF DISCHARGE:  11/20/2012  DISCHARGE DIAGNOSES: 1.  Right lower extremity cellulitis.  2.  Hyponatremia.  3.  Diabetes mellitus.   4.  Hypertension.  5.  Tobacco abuse.  6.  Obesity.   CONSULTANTS: Dr. Juliann PulseLundquist with surgery.   IMAGING STUDIES: Include:  1.  A tibia and fibula right x-ray showed no acute abnormalities.  2. MRI of the right lower extremity showed soft tissue changes, no osteomyelitis, no abscess or air.   ADMITTING HISTORY AND PHYSICAL: Please see detailed H and P dictated previously. In brief, a 44 year old Caucasian female patient with history of diabetes, hypertension and tobacco abuse presented to the hospital with complaints of fever, chills, right lower extremity tenderness and redness. The patient was found to have temperature of 104, tachycardia 120 and with right lower extremity cellulitis, sepsis was admitted to the hospitalist service. Sepsis was present on admission.   HOSPITAL COURSE: 1.  Right lower extremity cellulitis. The patient was seen by surgery for concern for some abscess but there was no drainable fluid. MRI was done which showed no air, fluid or osteomyelitis. The patient was treated with broad-spectrum antibiotics and by the day of discharge the patient is slowly improving, does have mild sloughing of skin on the ventral portion of the lower leg. No fever, normal white count. The patient is being discharged home on Augmentin and doxycycline for 7 more days and has a followup with Dr. Colette RibasByrd of surgery on 11/24/2012. 2.  Patient's diabetes and hypertension were well controlled during the hospital stay, was counseled to quit smoking.     DISCHARGE MEDICATIONS: Include:  1.  Effexor 75 mg oral once a day.  2.  Levothyroxine 88 mcg oral once a day.  3.  Vitamin B complex 1 tablet oral once a day.  4.  Vitamin D3 2000 international units  once a day.  5.  Percocet 5/325, 1 tablet every 6 hours as needed for pain.  6.  Augmentin 875 mg oral 2 times a day for 7 days.  7.  Doxycycline 100 mg oral twice a day for 7 days.  8.  Lactobacillus oral capsule 1 capsule oral once a day for 10 days.   DISCHARGE INSTRUCTIONS: Low-sodium, carbohydrate-controlled diet. Activity as tolerated. Follow up with primary care physician in 1 to 2 weeks and Dr. Colette RibasByrd on 11/24/2012.   TIME SPENT: On day of discharge in discharge activity was 40 minutes.    ____________________________ Molinda BailiffSrikar R. Asael Pann, MD srs:cs D: 11/21/2012 13:20:58 ET T: 11/21/2012 14:28:50 ET JOB#: [id number]  cc: Wardell HeathSrikar R. Zebulan Hinshaw, MD, <Dictator> Orie FishermanSRIKAR R Jocelyn Nold MD ELECTRONICALLY SIGNED 11/27/2012 20:45

## 2014-05-03 NOTE — H&P (Signed)
PATIENT NAME:  Wendy Wendy [name], Wendy Wendy [name] MR#:  [id number] DATE OF BIRTH:  1970/09/06  DATE OF ADMISSION:  11/22/2012  PRIMARY CARE PHYSICIAN: Dr. Jonette EvaEmma Wendy [name].   HISTORY OF PRESENT ILLNESS: The patient is Wendy [name] 44 year old Caucasian female with past medical history significant for history of recent admission from the 2nd to the 10th of November, 2014, for right lower extremity cellulitis, who presents back to the hospital with complaints of worsening right lower extremity swelling as well as increasing redness and more drainage. According to the  patient, she was doing well after discharge; however, over the past few days since discharge 10th of November, she has been noticing more swelling as well as increasing pain as well as increasing redness, spreading up more proximally as well as distally into her feet. She also was noted to have more yellowish drainage and having some fevers as high as 100s. The patient was also noted to have bullous transformation of her right lower extremity skin which has opening areas which are draining yellowish to greenish exudate. Her right lower extremity lateral bullae in the lower part of her shin is about 10 cm in diameter and seems to be new according to the patient's mother who is present during my interview.   PAST MEDICAL HISTORY: Significant for history of right lower extremity cellulitis treated from the 7th to the 10th of November 2014, discharged on doxycycline as well as Augmentin, hyponatremia, diabetes mellitus type 2. Apparently, the patient had gestational diabetes mellitus then she had diabetes after childbirth; however, approximately 3 or 4 years ago, she was told that her diabetes has gone away. Hypertension, tobacco abuse, obesity, hypothyroidism, anxiety, depression.   PAST SURGICAL HISTORY: Hysterectomy.   SOCIAL HISTORY: Smoked 1-1/2 packs Wendy [name] day since the age of 44, quit on discharge from the hospital the 10th of November 2014. No alcohol or illicit drug abuse.    FAMILY HISTORY: Both patient's parents had DVTs.   REVIEW OF SYSTEMS: CONSTITUTIONAL: Feverish and chilly. Admits of having right lower extremity pain, some cough with yellowish phlegm and some sinus congestion, intermittent nausea as well as urge as well as stress incontinence. Denies any fatigue or weakness or weight loss or gain.  EYES: Denies any blurry vision or double vision, glaucoma or cataracts.  HEENT:  Denies any tinnitus, allergies, epistaxis, sinus pain, dentures or difficulty swallowing.  RESPIRATORY: Denies any wheezes, asthma, COPD. CARDIOVASCULAR: Denies chest pain, orthopnea, arrhythmias, palpitations or syncope.  GASTROINTESTINAL: Denies any vomiting, diarrhea or constipation.  Has nausea.  GENITOURINARY: Denies dysuria, hematuria, frequency, or incontinence. ENDOCRINE: Denies polydipsia, nocturia, thyroid problems, heat or cold intolerance or thirst.  HEMATOLOGIC: Denies anemia, easy bruising, bleeding, or swollen glands.   SKIN: Denies any acne, rashes, or change in moles.  MUSCULOSKELETAL: Denies arthritis, cramps, swelling, gout.  NEUROLOGIC: No numbness, epilepsy, or tremors. PSYCHIATRIC: Denies anxiety, insomnia, depression.   PHYSICAL EXAMINATION:  VITAL SIGNS: Temperature 98.4, pulse was 83, respiratory rate was 18. Blood pressure 135/68 and saturation was 91% on room air.  GENERAL: Wendy [name] well-developed, well-nourished, pale Caucasian female, obese, lying on the stretcher.  HEENT: Pupils are equal, reactive to light. Extraocular muscles are intact. No icterus or conjunctivitis. Normal hearing. No pharyngeal erythema. Mucosa is moist.  NECK: No masses. Supple, nontender. Thyroid is not enlarged. No adenopathy. No JVD or bruits bilaterally. Full range of motion.  LUNGS: Clear to auscultation in all fields. No rales, rhonchi, diminished breath sounds or wheezing. No labored inspirations as well as increased effort, dullness to  percussion or overt respiratory distress.   CARDIOVASCULAR: S1, S2 appreciated. No murmur, gallop, or rubs noted. Rhythm is regular. PMI not lateralized. Chest is nontender to palpation.  EXTREMITIES: Show 1+ pedal pulses on the left and markedly diminished pedal pulses on the right; however, significant lower extremity swelling of the right was noted. Petechial bruising as well as bullae under the skin with thick, yellowish discharge was noted. Also erythema extended up proximally as well as distally to the foot with significant swelling of right lower extremity and pain on palpation.  The patient does have also discharge which is seropurulent discharge from her bullous areas. ABDOMEN: Soft, nontender. Bowel sounds are present. No hepatosplenomegaly or masses were noted.  RECTAL: Deferred.  MUSCLE STRENGTH: Able to move all extremities; however, significant pain whenever she moves her right lower extremity. No cyanosis, degenerative joint disease or kyphosis. Gait not tested.  SKIN: Revealed erythema, petechia in the right lower extremity and right shin. Also significant erythema, increased warmth to touch, and pain on palpation. Also bullae under the skin. No nodularity. The patient's skin was indurated; otherwise, hot and dry to palpation.  LYMPH:  No adenopathy in the cervical region.  NEUROLOGICAL: Cranial nerves grossly intact. Sensory is intact. No dysarthria or aphasia. The patient is alert and oriented to time, person, place, cooperative. Memory is good.  PSYCHIATRIC: No significant confusion, agitation or depression noted.   LABORATORIES: BMP showed glucose 144. Otherwise unremarkable. Liver enzymes: Albumin level of 2.6. White blood cell count was 10.3, hemoglobin 13.0, platelet count 417. Hgb a1c is not checked.   EKG is not done.   RADIOLOGIC STUDIES: Right lower extremity Doppler, foot, 12th of November 2014, revealed negative for DVT. Prominent right inguinal lymph nodes were noted.   ASSESSMENT AND PLAN:  1.  Right lower  extremity cellulitis. Admit the patient to the medical floor. Started on antibiotic therapy, broaden spectrum to vancomycin as well as Zosyn. We will get surgery to evaluate the patient. Will also continue the patient on pain medications as needed.  2.  Right lower extremity swelling. No deep vein thrombosis. Continue on heparin subcutaneously.  3.  Diabetes mellitus. Get hemoglobin A1c. The patient's family as well as the patient claims that she is not diabetic for the past few years.  4.  Tobacco abuse. Discussed cessation for approximately 3 minutes. Continue nicotine replacement therapy. The patient is agreeable.  5.  History of hypertension. The patient's blood pressure seemed to be well controlled. Continue outpatient management.  6.  Anxiety, depression. Continue medications.  7.  History of hypothyroidism. Check TSH if it was not done recently. Continue levothyroxine.   TIME SPENT: 50 minutes.   ____________________________ Katharina Caper, MD rv:np D: 11/22/2012 15:52:38 ET   T: 11/22/2012 16:42:59 ET JOB#: [id number]  cc: Katharina Caper, MD, <Dictator> Lucillie Garfinkel. Mayford Knife, MD  Katharina Caper MD ELECTRONICALLY SIGNED 12/20/2012 14:46

## 2015-10-13 ENCOUNTER — Emergency Department
Admission: EM | Admit: 2015-10-13 | Discharge: 2015-10-13 | Disposition: A | Discharge status: Home / Self Care | Payer: Worker's Compensation | Attending: Emergency Medicine | Admitting: Emergency Medicine

## 2015-10-13 ENCOUNTER — Encounter: Payer: Self-pay | Admitting: Emergency Medicine

## 2015-10-13 ENCOUNTER — Emergency Department: Payer: Worker's Compensation

## 2015-10-13 DIAGNOSIS — W0110XA Fall on same level from slipping, tripping and stumbling with subsequent striking against unspecified object, initial encounter: Secondary | ICD-10-CM | POA: Insufficient documentation

## 2015-10-13 DIAGNOSIS — Y9289 Other specified places as the place of occurrence of the external cause: Secondary | ICD-10-CM | POA: Insufficient documentation

## 2015-10-13 DIAGNOSIS — S42401A Unspecified fracture of lower end of right humerus, initial encounter for closed fracture: Secondary | ICD-10-CM

## 2015-10-13 DIAGNOSIS — E119 Type 2 diabetes mellitus without complications: Secondary | ICD-10-CM | POA: Insufficient documentation

## 2015-10-13 DIAGNOSIS — I10 Essential (primary) hypertension: Secondary | ICD-10-CM | POA: Diagnosis not present

## 2015-10-13 DIAGNOSIS — E039 Hypothyroidism, unspecified: Secondary | ICD-10-CM | POA: Diagnosis not present

## 2015-10-13 DIAGNOSIS — Y939 Activity, unspecified: Secondary | ICD-10-CM | POA: Diagnosis not present

## 2015-10-13 DIAGNOSIS — Z79899 Other long term (current) drug therapy: Secondary | ICD-10-CM | POA: Diagnosis not present

## 2015-10-13 DIAGNOSIS — S52121A Displaced fracture of head of right radius, initial encounter for closed fracture: Secondary | ICD-10-CM | POA: Diagnosis not present

## 2015-10-13 DIAGNOSIS — Z791 Long term (current) use of non-steroidal anti-inflammatories (NSAID): Secondary | ICD-10-CM | POA: Insufficient documentation

## 2015-10-13 DIAGNOSIS — S59901A Unspecified injury of right elbow, initial encounter: Secondary | ICD-10-CM | POA: Diagnosis present

## 2015-10-13 DIAGNOSIS — F329 Major depressive disorder, single episode, unspecified: Secondary | ICD-10-CM | POA: Insufficient documentation

## 2015-10-13 DIAGNOSIS — Y999 Unspecified external cause status: Secondary | ICD-10-CM | POA: Insufficient documentation

## 2015-10-13 DIAGNOSIS — F17210 Nicotine dependence, cigarettes, uncomplicated: Secondary | ICD-10-CM | POA: Insufficient documentation

## 2015-10-13 DIAGNOSIS — W19XXXA Unspecified fall, initial encounter: Secondary | ICD-10-CM

## 2015-10-13 HISTORY — DX: Essential (primary) hypertension: I10

## 2015-10-13 HISTORY — DX: Type 2 diabetes mellitus without complications: E11.9

## 2015-10-13 HISTORY — DX: Major depressive disorder, single episode, unspecified: F32.9

## 2015-10-13 HISTORY — DX: Disorder of thyroid, unspecified: E07.9

## 2015-10-13 HISTORY — DX: Depression, unspecified: F32.A

## 2015-10-13 MED ORDER — OXYCODONE-ACETAMINOPHEN 5-325 MG PO TABS
2.0000 | ORAL_TABLET | Freq: Once | ORAL | Status: AC
  Administered 2015-10-13: 2 via ORAL
  Filled 2015-10-13: qty 2

## 2015-10-13 MED ORDER — OXYCODONE-ACETAMINOPHEN 7.5-325 MG PO TABS: 1.0000 | ORAL_TABLET | ORAL | 0 refills | Status: DC | PRN

## 2015-10-13 NOTE — ED Provider Notes (Signed)
[facility] Emergency Department Provider Note   ____________________________________________   First MD Initiated Contact with Patient 10/13/15 [phone]     (approximate)  I have reviewed the triage vital signs and the nursing notes.   HISTORY  Chief Complaint Fall    HPI Wendy [name] is a 45 y.o. female patient complain of right upper extremity pain secondary to a trip and fall. Patient states she tripped on a wire fell patient stated since the fall she's been unable to move her right arm. Patient is also complaining of bilateral knee pain secondary to a fall. Instead occurred approximately 40 minutes ago. Patient denies loss of consciousness from the fall though she states she hit the back of her head. Patient is rating her pain as a 10 over 10. Patient described a pain as "sharp". No palliative measures prior to arrival. Patient was given a ice bag in triage.   Past Medical History:  Diagnosis Date  . Depression   . Diabetes mellitus without complication (HCC)   . Hypertension   . Thyroid disease     There are no active problems to display for this patient.   Past Surgical History:  Procedure Laterality Date  . ABDOMINAL HYSTERECTOMY    . CESAREAN SECTION      Prior to Admission medications   Medication Sig Start Date End Date Taking? Authorizing Provider  hydrochlorothiazide (HYDRODIURIL) 25 MG tablet Take 25 mg by mouth daily.   Yes Historical Provider, MD  ibuprofen (ADVIL,MOTRIN) 800 MG tablet Take 800 mg by mouth every 8 (eight) hours as needed.   Yes Historical Provider, MD  levothyroxine (SYNTHROID, LEVOTHROID) 100 MCG tablet Take 100 mcg by mouth daily before breakfast.   Yes Historical Provider, MD  thiamine (VITAMIN B-1) 100 MG tablet Take 100 mg by mouth daily.   Yes Historical Provider, MD  venlafaxine (EFFEXOR) 25 MG tablet Take 25 mg by mouth 2 (two) times daily.   Yes Historical Provider, MD  vitamin B-12 (CYANOCOBALAMIN)  1000 MCG tablet Take 1,000 mcg by mouth daily.   Yes Historical Provider, MD  oxyCODONE-acetaminophen (PERCOCET) 7.5-325 MG tablet Take 1 tablet by mouth every 4 (four) hours as needed for severe pain. 10/13/15   Joni Reining, PA-C    Allergies Review of patient's allergies indicates no known allergies.  History reviewed. No pertinent family history.  Social History Social History  Substance Use Topics  . Smoking status: Current Every Day Smoker    Packs/day: 1.50    Types: Cigarettes  . Smokeless tobacco: Never Used  . Alcohol use No    Review of Systems Constitutional: No fever/chills Eyes: No visual changes. ENT: No sore throat. Cardiovascular: Denies chest pain. Respiratory: Denies shortness of breath. Gastrointestinal: No abdominal pain.  No nausea, no vomiting.  No diarrhea.  No constipation. Genitourinary: Negative for dysuria. Musculoskeletal: Negative for back pain. Skin: Negative for rash. Neurological: Negative for headaches, focal weakness or numbness. Psychiatric:Depression Endocrine:Hypertension, hypothyroidism, and diabetes   ____________________________________________   PHYSICAL EXAM:  VITAL SIGNS: ED Triage Vitals  Enc Vitals Group     BP 10/13/15 0659 (!) 157/77     Pulse Rate 10/13/15 0659 68     Resp 10/13/15 0659 16     Temp 10/13/15 0659 98.6 F (37 C)     Temp Source 10/13/15 0659 Oral     SpO2 10/13/15 0659 98 %     Weight 10/13/15 0701 250 lb (113.4 kg)     Height  10/13/15 0701 5\' 2"  (1.575 m)     Head Circumference --      Peak Flow --      Pain Score 10/13/15 0701 10     Pain Loc --      Pain Edu? --      Excl. in GC? --     Constitutional: Alert and oriented. Well appearing and in no acute distress. Eyes: Conjunctivae are normal. PERRL. EOMI. Head: Atraumatic. Nose: No congestion/rhinnorhea. Mouth/Throat: Mucous membranes are moist.  Oropharynx non-erythematous. Neck: No stridor.  No cervical spine tenderness to  palpation. Hematological/Lymphatic/Immunilogical: No cervical lymphadenopathy. Cardiovascular: Normal rate, regular rhythm. Grossly normal heart sounds.  Good peripheral circulation. Respiratory: Normal respiratory effort.  No retractions. Lungs CTAB. Gastrointestinal: Soft and nontender. No distention. No abdominal bruits. No CVA tenderness. Musculoskeletal: No lower extremity tenderness nor edema.  No joint effusions. Neurologic:  Normal speech and language. No gross focal neurologic deficits are appreciated. No gait instability. Skin:  Skin is warm, dry and intact. No rash noted. Psychiatric: Mood and affect are normal. Speech and behavior are normal.  ____________________________________________   LABS (all labs ordered are listed, but only abnormal results are displayed)  Labs Reviewed - No data to display ____________________________________________  EKG   ____________________________________________  RADIOLOGY  X-ray consistent with a comminuted radial head fracture. ____________________________________________   PROCEDURES  Procedure(s) performed: None  Procedures  Critical Care performed: No  ____________________________________________   INITIAL IMPRESSION / ASSESSMENT AND PLAN / ED COURSE  Pertinent labs & imaging results that were available during my care of the patient were reviewed by me and considered in my medical decision making (see chart for details).  Right radial head fracture. Discussed x-ray finding with patient. Patient placed in a splint and sling. Patient given a prescription for Percocets advised to follow-up with orthopedics.  Clinical Course     ____________________________________________   FINAL CLINICAL IMPRESSION(S) / ED DIAGNOSES  Final diagnoses:  Fall  Elbow fracture, right, closed, initial encounter      NEW MEDICATIONS STARTED DURING THIS VISIT:  New Prescriptions   OXYCODONE-ACETAMINOPHEN (PERCOCET) 7.5-325 MG  TABLET    Take 1 tablet by mouth every 4 (four) hours as needed for severe pain.     Note:  This document was prepared using Dragon voice recognition software and may include unintentional dictation errors.    Joni Reiningonald K Smith, PA-C 10/13/15 [id number]    Nita Sicklearolina Veronese, MD 10/14/15 [phone]

## 2015-10-13 NOTE — ED Notes (Signed)
See triage note  States she tripped going into a pt's room  Having pain to right arm  Mainly at the wrist and elbow area  Also hit her head  No LOC

## 2015-10-13 NOTE — Discharge Instructions (Signed)
Wear splint and sling until evaluation by orthopedic Dr. °

## 2015-10-13 NOTE — ED Triage Notes (Signed)
Pt presents to ED to evaluated for fall. States trip on wire and fell. Pt states she hit her head and right arm. States can't move right arm.

## 2016-04-29 ENCOUNTER — Emergency Department
Admission: EM | Admit: 2016-04-29 | Discharge: 2016-04-29 | Disposition: A | Discharge status: Home / Self Care | Payer: Managed Care, Other (non HMO) | Attending: Emergency Medicine | Admitting: Emergency Medicine

## 2016-04-29 ENCOUNTER — Emergency Department: Payer: Managed Care, Other (non HMO)

## 2016-04-29 ENCOUNTER — Encounter: Payer: Self-pay | Admitting: Emergency Medicine

## 2016-04-29 DIAGNOSIS — Z79899 Other long term (current) drug therapy: Secondary | ICD-10-CM | POA: Insufficient documentation

## 2016-04-29 DIAGNOSIS — E119 Type 2 diabetes mellitus without complications: Secondary | ICD-10-CM | POA: Diagnosis not present

## 2016-04-29 DIAGNOSIS — L03213 Periorbital cellulitis: Secondary | ICD-10-CM

## 2016-04-29 DIAGNOSIS — F17210 Nicotine dependence, cigarettes, uncomplicated: Secondary | ICD-10-CM | POA: Insufficient documentation

## 2016-04-29 DIAGNOSIS — H578 Other specified disorders of eye and adnexa: Secondary | ICD-10-CM | POA: Diagnosis present

## 2016-04-29 DIAGNOSIS — H00016 Hordeolum externum left eye, unspecified eyelid: Secondary | ICD-10-CM | POA: Diagnosis not present

## 2016-04-29 DIAGNOSIS — I10 Essential (primary) hypertension: Secondary | ICD-10-CM | POA: Diagnosis not present

## 2016-04-29 DIAGNOSIS — Z791 Long term (current) use of non-steroidal anti-inflammatories (NSAID): Secondary | ICD-10-CM | POA: Insufficient documentation

## 2016-04-29 LAB — COMPREHENSIVE METABOLIC PANEL
ALT: 23 U/L (ref 14–54)
AST: 24 U/L (ref 15–41)
Albumin: 4.2 g/dL (ref 3.5–5.0)
Alkaline Phosphatase: 71 U/L (ref 38–126)
Anion gap: 9 (ref 5–15)
BUN: 13 mg/dL (ref 6–20)
CO2: 27 mmol/L (ref 22–32)
Calcium: 9.5 mg/dL (ref 8.9–10.3)
Chloride: 102 mmol/L (ref 101–111)
Creatinine, Ser: 0.71 mg/dL (ref 0.44–1.00)
GFR calc Af Amer: >60 mL/min (ref >60)
GFR calc non Af Amer: >60 mL/min (ref >60)
Glucose, Bld: 190 mg/dL — ABNORMAL HIGH (ref 65–99)
Potassium: 3.8 mmol/L (ref 3.5–5.1)
Sodium: 138 mmol/L (ref 135–145)
Total Bilirubin: 0.8 mg/dL (ref 0.3–1.2)
Total Protein: 8.3 g/dL — ABNORMAL HIGH (ref 6.5–8.1)

## 2016-04-29 LAB — CBC
HCT: 47.8 % — ABNORMAL HIGH (ref 35.0–47.0)
Hemoglobin: 16.3 g/dL — ABNORMAL HIGH (ref 12.0–16.0)
MCH: 30.5 pg (ref 26.0–34.0)
MCHC: 34.2 g/dL (ref 32.0–36.0)
MCV: 89.1 fL (ref 80.0–100.0)
Platelets: 257 10*3/uL (ref 150–440)
RBC: 5.36 MIL/uL — ABNORMAL HIGH (ref 3.80–5.20)
RDW: 14.3 % (ref 11.5–14.5)
WBC: 8.2 10*3/uL (ref 3.6–11.0)

## 2016-04-29 MED ORDER — IOPAMIDOL (ISOVUE-300) INJECTION 61%
75.0000 mL | Freq: Once | INTRAVENOUS | Status: AC | PRN
  Administered 2016-04-29: 75 mL via INTRAVENOUS

## 2016-04-29 MED ORDER — PIPERACILLIN-TAZOBACTAM 3.375 G IVPB
3.3750 g | Freq: Once | INTRAVENOUS | Status: AC
  Administered 2016-04-29: 3.375 g via INTRAVENOUS
  Filled 2016-04-29: qty 50

## 2016-04-29 MED ORDER — PREDNISONE 10 MG (21) PO TBPK: ORAL_TABLET | ORAL | 0 refills | Status: DC

## 2016-04-29 MED ORDER — OXYCODONE-ACETAMINOPHEN 5-325 MG PO TABS: 2.0000 | ORAL_TABLET | Freq: Four times a day (QID) | ORAL | 0 refills | Status: DC | PRN

## 2016-04-29 MED ORDER — AMOXICILLIN-POT CLAVULANATE 875-125 MG PO TABS
1.0000 | ORAL_TABLET | Freq: Two times a day (BID) | ORAL | 0 refills | Status: AC
Start: 2016-04-29 — End: 2016-05-09

## 2016-04-29 NOTE — ED Triage Notes (Signed)
Patient ambulatory to triage with steady gait, without difficulty or distress noted; pt st noted ?stye to left eye few days ago; now with significant swelling to upper eye lid, tenderness with palpation to area around eye

## 2016-04-29 NOTE — ED Provider Notes (Signed)
[facility] Emergency Department Provider Note   First MD Initiated Contact with Patient 04/29/16 [phone]     (approximate)  I have reviewed the triage vital signs and the nursing notes.   HISTORY  Chief Complaint Facial Swelling    HPI Wendy [name] is a 46 y.o. female with below list of current medical conditions including diabetes presents to the emergency Department with left eye swelling and redness 3 days. Patient states that she thought this was secondary to a stye however swelling and pain is worsen over that period of time. Patient states her current pain score is 8 out of 10. Patient does admit to pain with movement of the eye. Patient denies any fever or if her overall presentation temperature 98.3.    Past Medical History:  Diagnosis Date  . Depression   . Diabetes mellitus without complication (HCC)   . Hypertension   . Thyroid disease     There are no active problems to display for this patient.   Past Surgical History:  Procedure Laterality Date  . ABDOMINAL HYSTERECTOMY    . CESAREAN SECTION    . NOSE SURGERY      Prior to Admission medications   Medication Sig Start Date End Date Taking? Authorizing Provider  hydrochlorothiazide (HYDRODIURIL) 25 MG tablet Take 25 mg by mouth daily.   Yes Historical Provider, MD  ibuprofen (ADVIL,MOTRIN) 800 MG tablet Take 800 mg by mouth every 8 (eight) hours as needed.   Yes Historical Provider, MD  levothyroxine (SYNTHROID, LEVOTHROID) 100 MCG tablet Take 100 mcg by mouth daily before breakfast.   Yes Historical Provider, MD  Prenatal Vit-Fe Fumarate-FA (MULTIVITAMIN-PRENATAL) 27-0.8 MG TABS tablet Take 1 tablet by mouth daily at 12 noon.   Yes Historical Provider, MD  thiamine (VITAMIN B-1) 100 MG tablet Take 100 mg by mouth daily.   Yes Historical Provider, MD  venlafaxine (EFFEXOR) 25 MG tablet Take 25 mg by mouth daily.    Yes Historical Provider, MD  vitamin B-12 (CYANOCOBALAMIN) 1000  MCG tablet Take 1,000 mcg by mouth daily.   Yes Historical Provider, MD  amoxicillin-clavulanate (AUGMENTIN) 875-125 MG tablet Take 1 tablet by mouth every 12 (twelve) hours. 04/29/16 05/09/16  Emily Filbert, MD  oxyCODONE-acetaminophen (PERCOCET) 5-325 MG tablet Take 2 tablets by mouth every 6 (six) hours as needed for moderate pain or severe pain. 04/29/16   Emily Filbert, MD  oxyCODONE-acetaminophen (PERCOCET) 7.5-325 MG tablet Take 1 tablet by mouth every 4 (four) hours as needed for severe pain. Patient not taking: Reported on 04/29/2016 10/13/15   Joni Reining, PA-C  predniSONE (STERAPRED UNI-PAK 21 TAB) 10 MG (21) TBPK tablet Dispense steroid taper pack as directed 04/29/16   Emily Filbert, MD    Allergies Sulfa antibiotics  No family history on file.  Social History Social History  Substance Use Topics  . Smoking status: Current Every Day Smoker    Packs/day: 1.50    Types: Cigarettes  . Smokeless tobacco: Never Used  . Alcohol use No    Review of Systems Constitutional: No fever/chills Eyes: No visual changes.Positive for swelling around the left eye ENT: No sore throat. Cardiovascular: Denies chest pain. Respiratory: Denies shortness of breath. Gastrointestinal: No abdominal pain.  No nausea, no vomiting.  No diarrhea.  No constipation. Genitourinary: Negative for dysuria. Musculoskeletal: Negative for back pain. Skin: Negative for rash. Neurological: Negative for headaches, focal weakness or numbness.  10-point ROS otherwise negative.  ____________________________________________  PHYSICAL EXAM:  VITAL SIGNS: ED Triage Vitals  Enc Vitals Group     BP 04/29/16 0612 (!) 151/76     Pulse Rate 04/29/16 0612 74     Resp 04/29/16 0612 18     Temp 04/29/16 0612 98.3 F (36.8 C)     Temp Source 04/29/16 0612 Oral     SpO2 04/29/16 0612 95 %     Weight 04/29/16 0610 264 lb (119.7 kg)     Height 04/29/16 0610  (1.575 m)     Head Circumference  --      Peak Flow --      Pain Score 04/29/16 0610 8     Pain Loc --      Pain Edu? --      Excl. in GC? --     Constitutional: Alert and oriented. Well appearing and in no acute distress. Eyes: Conjunctivae are normal. PERRL.Left periorbital swelling, erythema or exudate noted lateral canthus. Pain with extraocular motion Head: Atraumatic. Nose: No congestion/rhinnorhea. Mouth/Throat: Mucous membranes are moist. Oropharynx non-erythematous. Neck: No stridor.   Cardiovascular: Normal rate, regular rhythm. Good peripheral circulation. Grossly normal heart sounds. Respiratory: Normal respiratory effort.  No retractions. Lungs CTAB. Gastrointestinal: Soft and nontender. No distention.  Musculoskeletal: No lower extremity tenderness nor edema. No gross deformities of extremities. Neurologic:  Normal speech and language. No gross focal neurologic deficits are appreciated.  Skin:  Skin is warm, dry and intact. No rash noted. Psychiatric: Mood and affect are normal. Speech and behavior are normal.  ____________________________________________   LABS (all labs ordered are listed, but only abnormal results are displayed)  Labs Reviewed  CBC - Abnormal; Notable for the following:       Result Value   RBC 5.36 (*)    Hemoglobin 16.3 (*)    HCT 47.8 (*)    All other components within normal limits  COMPREHENSIVE METABOLIC PANEL - Abnormal; Notable for the following:    Glucose, Bld 190 (*)    Total Protein 8.3 (*)    All other components within normal limits  CULTURE, BLOOD (ROUTINE X 2)  CULTURE, BLOOD (ROUTINE X 2)    RADIOLOGY I, Endwell N BROWN, personally viewed and evaluated these images (plain radiographs) as part of my medical decision making, as well as reviewing the written report by the radiologist.  Ct Maxillofacial W Contrast  Result Date: 04/29/2016 CLINICAL DATA:  Left eye swelling and pain. Rule out orbital cellulitis abscess EXAM: CT MAXILLOFACIAL WITH CONTRAST  TECHNIQUE: Multidetector CT imaging of the maxillofacial structures was performed. Multiplanar CT image reconstructions were also generated. A small metallic BB was placed on the right temple in order to reliably differentiate right from left. CONTRAST:  75 mL Isovue-300 IV COMPARISON:  None. FINDINGS: Osseous: Negative for fracture or mass. No evidence of osteomyelitis. Orbits: Soft tissue swelling of the eyelid on the left. There is a small rim enhancing fluid collection within the soft tissues the eyelid on the left, located laterally. This measures approximately 3 x 10 mm. Orbital fat normal. Muscles and optic nerve normal. Globe is normal. No evidence of orbital cellulitis or abscess. Sinuses: Mild mucosal edema in the maxillary sinus bilaterally. Ethmoid sinuses clear. Frontal sinuses clear. No air-fluid level. Soft tissues: Left orbital swelling as above Limited intracranial: Negative IMPRESSION: Preseptal cellulitis with soft tissue swelling left eyelid. There is a rim enhancing 3 x 10 mm fluid collection within the left eyelid compatible with an infected internal stye. Electronically Signed  By: Marlan Palau M.D.   On: 04/29/2016 08:35      Procedures   ____________________________________________   INITIAL IMPRESSION / ASSESSMENT AND PLAN / ED COURSE  Pertinent labs & imaging results that were available during my care of the patient were reviewed by me and considered in my medical decision making (see chart for details).  46 year old female presenting with left periorbital cellulitis concerning for possible orbital involvement such CT scan was performed. Patient given Zosyn IV. Patient's care transferred to Dr. Mayford Knife    ____________________________________________  FINAL CLINICAL IMPRESSION(S) / ED DIAGNOSES  Final diagnoses:  Hordeolum externum of left eye, unspecified eyelid  Periorbital cellulitis of left eye     MEDICATIONS GIVEN DURING THIS VISIT:  Medications    piperacillin-tazobactam (ZOSYN) IVPB 3.375 g (0 g Intravenous Stopped 04/29/16 0940)  iopamidol (ISOVUE-300) 61 % injection 75 mL (75 mLs Intravenous Contrast Given 04/29/16 0756)     NEW OUTPATIENT MEDICATIONS STARTED DURING THIS VISIT:  Discharge Medication List as of 04/29/2016  9:03 AM    START taking these medications   Details  amoxicillin-clavulanate (AUGMENTIN) 875-125 MG tablet Take 1 tablet by mouth every 12 (twelve) hours., Starting Thu 04/29/2016, Until Sun 05/09/2016, Print    oxyCODONE-acetaminophen (PERCOCET) 5-325 MG tablet Take 2 tablets by mouth every 6 (six) hours as needed for moderate pain or severe pain., Starting Thu 04/29/2016, Print    predniSONE (STERAPRED UNI-PAK 21 TAB) 10 MG (21) TBPK tablet Dispense steroid taper pack as directed, Print        Discharge Medication List as of 04/29/2016  9:03 AM      Discharge Medication List as of 04/29/2016  9:03 AM       Note:  This document was prepared using Dragon voice recognition software and may include unintentional dictation errors.    Darci Current, MD 04/30/16 [phone]

## 2016-04-29 NOTE — ED Provider Notes (Signed)
IMPRESSION: Preseptal cellulitis with soft tissue swelling left eyelid. There is a rim enhancing 3 x 10 mm fluid collection within the left eyelid compatible with an infected internal stye.  CT scan confirms periorbital cellulitis with the source being an infected stye. Patient will continue oral antibiotics as well as steroids warm compresses. She is stable for outpatient follow-up.   Emily Filbert, MD 04/29/16 [phone]

## 2016-04-29 NOTE — ED Notes (Signed)
Patient transported to CT 

## 2016-04-29 NOTE — ED Notes (Signed)
Several IV attempts made at this point by 3 different RN's. Another RN attempting at this time.

## 2016-05-04 LAB — CULTURE, BLOOD (ROUTINE X 2)
Culture: NO GROWTH
Culture: NO GROWTH
Special Requests: ADEQUATE
Special Requests: ADEQUATE

## 2017-11-14 ENCOUNTER — Encounter: Payer: Self-pay | Admitting: Neurology

## 2017-12-23 ENCOUNTER — Other Ambulatory Visit: Payer: Self-pay | Admitting: Family Medicine

## 2017-12-23 DIAGNOSIS — Z1231 Encounter for screening mammogram for malignant neoplasm of breast: Secondary | ICD-10-CM

## 2018-01-26 ENCOUNTER — Ambulatory Visit: Payer: Self-pay | Admitting: Neurology

## 2018-04-18 ENCOUNTER — Other Ambulatory Visit: Payer: Self-pay

## 2018-04-18 ENCOUNTER — Other Ambulatory Visit: Payer: Self-pay | Admitting: Family Medicine

## 2018-04-18 ENCOUNTER — Ambulatory Visit
Admission: RE | Admit: 2018-04-18 | Discharge: 2018-04-18 | Disposition: A | Discharge status: Home / Self Care | Payer: 59 | Source: Ambulatory Visit | Attending: Family Medicine | Admitting: Family Medicine

## 2018-04-18 DIAGNOSIS — R509 Fever, unspecified: Secondary | ICD-10-CM

## 2018-05-01 DIAGNOSIS — A689 Relapsing fever, unspecified: Secondary | ICD-10-CM | POA: Diagnosis not present

## 2018-05-01 DIAGNOSIS — M2550 Pain in unspecified joint: Secondary | ICD-10-CM | POA: Diagnosis not present

## 2018-05-02 DIAGNOSIS — M2550 Pain in unspecified joint: Secondary | ICD-10-CM | POA: Diagnosis not present

## 2018-05-02 DIAGNOSIS — A689 Relapsing fever, unspecified: Secondary | ICD-10-CM | POA: Diagnosis not present

## 2018-07-09 DIAGNOSIS — Z1159 Encounter for screening for other viral diseases: Secondary | ICD-10-CM | POA: Diagnosis not present

## 2018-07-09 DIAGNOSIS — L03115 Cellulitis of right lower limb: Secondary | ICD-10-CM | POA: Diagnosis not present

## 2018-07-11 ENCOUNTER — Other Ambulatory Visit: Payer: Self-pay

## 2018-07-11 ENCOUNTER — Inpatient Hospital Stay: Payer: 59

## 2018-07-11 ENCOUNTER — Inpatient Hospital Stay
Admission: EM | Admit: 2018-07-11 | Discharge: 2018-07-13 | DRG: 872 | Disposition: A | Discharge status: Home / Self Care | Payer: 59 | Attending: Internal Medicine | Admitting: Internal Medicine

## 2018-07-11 ENCOUNTER — Encounter: Payer: Self-pay | Admitting: Emergency Medicine

## 2018-07-11 ENCOUNTER — Emergency Department: Payer: 59

## 2018-07-11 DIAGNOSIS — Z79899 Other long term (current) drug therapy: Secondary | ICD-10-CM | POA: Diagnosis not present

## 2018-07-11 DIAGNOSIS — Z20828 Contact with and (suspected) exposure to other viral communicable diseases: Secondary | ICD-10-CM | POA: Diagnosis present

## 2018-07-11 DIAGNOSIS — Z8262 Family history of osteoporosis: Secondary | ICD-10-CM

## 2018-07-11 DIAGNOSIS — M7989 Other specified soft tissue disorders: Secondary | ICD-10-CM

## 2018-07-11 DIAGNOSIS — M79604 Pain in right leg: Secondary | ICD-10-CM

## 2018-07-11 DIAGNOSIS — E039 Hypothyroidism, unspecified: Secondary | ICD-10-CM | POA: Diagnosis not present

## 2018-07-11 DIAGNOSIS — E119 Type 2 diabetes mellitus without complications: Secondary | ICD-10-CM | POA: Diagnosis not present

## 2018-07-11 DIAGNOSIS — L03115 Cellulitis of right lower limb: Secondary | ICD-10-CM | POA: Diagnosis present

## 2018-07-11 DIAGNOSIS — Z79890 Hormone replacement therapy (postmenopausal): Secondary | ICD-10-CM | POA: Diagnosis not present

## 2018-07-11 DIAGNOSIS — R69 Illness, unspecified: Secondary | ICD-10-CM | POA: Diagnosis not present

## 2018-07-11 DIAGNOSIS — Z832 Family history of diseases of the blood and blood-forming organs and certain disorders involving the immune mechanism: Secondary | ICD-10-CM | POA: Diagnosis not present

## 2018-07-11 DIAGNOSIS — F17200 Nicotine dependence, unspecified, uncomplicated: Secondary | ICD-10-CM | POA: Diagnosis present

## 2018-07-11 DIAGNOSIS — F329 Major depressive disorder, single episode, unspecified: Secondary | ICD-10-CM | POA: Diagnosis present

## 2018-07-11 DIAGNOSIS — E876 Hypokalemia: Secondary | ICD-10-CM | POA: Diagnosis present

## 2018-07-11 DIAGNOSIS — A419 Sepsis, unspecified organism: Principal | ICD-10-CM | POA: Diagnosis present

## 2018-07-11 DIAGNOSIS — Z882 Allergy status to sulfonamides status: Secondary | ICD-10-CM | POA: Diagnosis not present

## 2018-07-11 DIAGNOSIS — I10 Essential (primary) hypertension: Secondary | ICD-10-CM | POA: Diagnosis not present

## 2018-07-11 LAB — COMPREHENSIVE METABOLIC PANEL
ALT: 21 U/L (ref 0–44)
AST: 16 U/L (ref 15–41)
Albumin: 3.6 g/dL (ref 3.5–5.0)
Alkaline Phosphatase: 64 U/L (ref 38–126)
Anion gap: 11 (ref 5–15)
BUN: 10 mg/dL (ref 6–20)
CO2: 26 mmol/L (ref 22–32)
Calcium: 8.5 mg/dL — ABNORMAL LOW (ref 8.9–10.3)
Chloride: 97 mmol/L — ABNORMAL LOW (ref 98–111)
Creatinine, Ser: 0.69 mg/dL (ref 0.44–1.00)
GFR calc Af Amer: >60 mL/min (ref >60)
GFR calc non Af Amer: >60 mL/min (ref >60)
Glucose, Bld: 139 mg/dL — ABNORMAL HIGH (ref 70–99)
Potassium: 3.1 mmol/L — ABNORMAL LOW (ref 3.5–5.1)
Sodium: 134 mmol/L — ABNORMAL LOW (ref 135–145)
Total Bilirubin: 0.7 mg/dL (ref 0.3–1.2)
Total Protein: 8 g/dL (ref 6.5–8.1)

## 2018-07-11 LAB — CBC WITH DIFFERENTIAL/PLATELET
Abs Immature Granulocytes: 0.03 10*3/uL (ref 0.00–0.07)
Basophils Absolute: 0 10*3/uL (ref 0.0–0.1)
Basophils Relative: 0 %
Eosinophils Absolute: 0 10*3/uL (ref 0.0–0.5)
Eosinophils Relative: 0 %
HCT: 46.1 % — ABNORMAL HIGH (ref 36.0–46.0)
Hemoglobin: 15.6 g/dL — ABNORMAL HIGH (ref 12.0–15.0)
Immature Granulocytes: 0 %
Lymphocytes Relative: 12 %
Lymphs Abs: 1.3 10*3/uL (ref 0.7–4.0)
MCH: 29.7 pg (ref 26.0–34.0)
MCHC: 33.8 g/dL (ref 30.0–36.0)
MCV: 87.8 fL (ref 80.0–100.0)
Monocytes Absolute: 0.6 10*3/uL (ref 0.1–1.0)
Monocytes Relative: 5 %
Neutro Abs: 9.2 10*3/uL — ABNORMAL HIGH (ref 1.7–7.7)
Neutrophils Relative %: 83 %
Platelets: 192 10*3/uL (ref 150–400)
RBC: 5.25 MIL/uL — ABNORMAL HIGH (ref 3.87–5.11)
RDW: 14.2 % (ref 11.5–15.5)
WBC: 11.1 10*3/uL — ABNORMAL HIGH (ref 4.0–10.5)
nRBC: 0 % (ref 0.0–0.2)

## 2018-07-11 LAB — LACTIC ACID, PLASMA: Lactic Acid, Venous: 1.5 mmol/L (ref 0.5–1.9)

## 2018-07-11 LAB — PROTIME-INR
INR: 1.1 (ref 0.8–1.2)
Prothrombin Time: 13.8 seconds (ref 11.4–15.2)

## 2018-07-11 MED ORDER — ACETAMINOPHEN 325 MG PO TABS: 650.0000 mg | ORAL_TABLET | Freq: Four times a day (QID) | ORAL | Status: DC | PRN

## 2018-07-11 MED ORDER — SODIUM CHLORIDE 0.9% FLUSH
3.0000 mL | Freq: Once | INTRAVENOUS | Status: AC
  Administered 2018-07-11: 3 mL via INTRAVENOUS

## 2018-07-11 MED ORDER — B COMPLEX-C PO TABS
1.0000 | ORAL_TABLET | Freq: Every day | ORAL | Status: DC
  Administered 2018-07-12 – 2018-07-13 (×2): 1 via ORAL
  Filled 2018-07-11 (×2): qty 1

## 2018-07-11 MED ORDER — VANCOMYCIN HCL IN DEXTROSE 1-5 GM/200ML-% IV SOLN
1000.0000 mg | Freq: Once | INTRAVENOUS | Status: AC
  Administered 2018-07-11: 13:00:00 1000 mg via INTRAVENOUS
  Filled 2018-07-11: qty 200

## 2018-07-11 MED ORDER — SODIUM CHLORIDE 0.9 % IV SOLN
2.0000 g | Freq: Once | INTRAVENOUS | Status: AC
  Administered 2018-07-11: 2 g via INTRAVENOUS
  Filled 2018-07-11: qty 20

## 2018-07-11 MED ORDER — POTASSIUM CHLORIDE CRYS ER 20 MEQ PO TBCR
40.0000 meq | EXTENDED_RELEASE_TABLET | Freq: Once | ORAL | Status: AC
  Administered 2018-07-11: 16:00:00 40 meq via ORAL
  Filled 2018-07-11: qty 2

## 2018-07-11 MED ORDER — SODIUM CHLORIDE 0.9 % IV SOLN
INTRAVENOUS | Status: DC | PRN
  Administered 2018-07-11: 20 mL via INTRAVENOUS
  Administered 2018-07-12: 250 mL via INTRAVENOUS

## 2018-07-11 MED ORDER — LEVOTHYROXINE SODIUM 88 MCG PO TABS
88.0000 ug | ORAL_TABLET | Freq: Every day | ORAL | Status: DC
  Administered 2018-07-12 – 2018-07-13 (×2): 88 ug via ORAL
  Filled 2018-07-11 (×2): qty 1

## 2018-07-11 MED ORDER — KETOROLAC TROMETHAMINE 30 MG/ML IJ SOLN
30.0000 mg | Freq: Four times a day (QID) | INTRAMUSCULAR | Status: DC | PRN
  Administered 2018-07-11 – 2018-07-12 (×4): 30 mg via INTRAVENOUS
  Filled 2018-07-11 (×4): qty 1

## 2018-07-11 MED ORDER — ONDANSETRON HCL 4 MG PO TABS: 4.0000 mg | ORAL_TABLET | Freq: Four times a day (QID) | ORAL | Status: DC | PRN

## 2018-07-11 MED ORDER — SEMAGLUTIDE(0.25 OR 0.5MG/DOS) 2 MG/1.5ML ~~LOC~~ SOPN: 0.5000 mg | PEN_INJECTOR | SUBCUTANEOUS | Status: DC

## 2018-07-11 MED ORDER — VITAMIN D 25 MCG (1000 UNIT) PO TABS
1000.0000 [IU] | ORAL_TABLET | Freq: Every day | ORAL | Status: DC
  Administered 2018-07-12 – 2018-07-13 (×2): 1000 [IU] via ORAL
  Filled 2018-07-11 (×2): qty 1

## 2018-07-11 MED ORDER — VENLAFAXINE HCL ER 75 MG PO CP24
75.0000 mg | ORAL_CAPSULE | Freq: Every day | ORAL | Status: DC
  Administered 2018-07-12 – 2018-07-13 (×2): 75 mg via ORAL
  Filled 2018-07-11 (×2): qty 1

## 2018-07-11 MED ORDER — ENOXAPARIN SODIUM 40 MG/0.4ML ~~LOC~~ SOLN
40.0000 mg | Freq: Two times a day (BID) | SUBCUTANEOUS | Status: DC
  Administered 2018-07-11 – 2018-07-13 (×4): 40 mg via SUBCUTANEOUS
  Filled 2018-07-11 (×4): qty 0.4

## 2018-07-11 MED ORDER — HYDROCHLOROTHIAZIDE 25 MG PO TABS
25.0000 mg | ORAL_TABLET | Freq: Every day | ORAL | Status: DC
  Administered 2018-07-12 – 2018-07-13 (×2): 25 mg via ORAL
  Filled 2018-07-11 (×2): qty 1

## 2018-07-11 MED ORDER — HYDROCODONE-ACETAMINOPHEN 5-325 MG PO TABS
1.0000 | ORAL_TABLET | ORAL | Status: DC | PRN
  Administered 2018-07-11 – 2018-07-13 (×4): 1 via ORAL
  Filled 2018-07-11 (×4): qty 1

## 2018-07-11 MED ORDER — ENOXAPARIN SODIUM 40 MG/0.4ML ~~LOC~~ SOLN: 40.0000 mg | SUBCUTANEOUS | Status: DC

## 2018-07-11 MED ORDER — ONDANSETRON HCL 4 MG/2ML IJ SOLN: 4.0000 mg | Freq: Four times a day (QID) | INTRAMUSCULAR | Status: DC | PRN

## 2018-07-11 MED ORDER — ACETAMINOPHEN 650 MG RE SUPP: 650.0000 mg | Freq: Four times a day (QID) | RECTAL | Status: DC | PRN

## 2018-07-11 MED ORDER — VANCOMYCIN HCL IN DEXTROSE 1-5 GM/200ML-% IV SOLN
1000.0000 mg | Freq: Once | INTRAVENOUS | Status: AC
  Administered 2018-07-11: 12:00:00 1000 mg via INTRAVENOUS

## 2018-07-11 MED ORDER — VANCOMYCIN HCL 10 G IV SOLR
1750.0000 mg | INTRAVENOUS | Status: DC
  Administered 2018-07-11: 23:00:00 1750 mg via INTRAVENOUS
  Filled 2018-07-11 (×3): qty 1750

## 2018-07-11 NOTE — ED Notes (Signed)
IV attempted. Able to draw blood but IV failed to flush.

## 2018-07-11 NOTE — ED Notes (Signed)
2nd set of blood cultures drawn with fresh IV access, blood coming slowly through [place].  Only able to send blue top tube.

## 2018-07-11 NOTE — ED Triage Notes (Signed)
Pt here with c/o cellulitis to right leg. Was seen at [facility] on Sunday, rx antibiotics (cephalexin 500mg  x3 daily). Pt reports pain has gotten worse. Reports fever started Saturday.

## 2018-07-11 NOTE — Plan of Care (Signed)
Pt admitted today from the ED. VSS. Norco given once for pain to R LE with improvement.

## 2018-07-11 NOTE — ED Notes (Signed)
Pt placed on 2L O2 via n/c for O2 sat 88% on RA while sleeping. While awake O2 sat is WNL on RA.

## 2018-07-11 NOTE — Consult Note (Signed)
Pharmacy Antibiotic Note  Wendy [name] is a 48 y.o. female admitted on 07/11/2018 with cellulitis.  Pharmacy has been consulted for Vancomycin dosing. Patient received 2g total Vancomycin IV in the ED as a loading dose.  Plan: Vancomycin 1750 mg IV Q 24 hrs. Goal AUC 400-550. Expected AUC: 502.0 Expected Cssmin: 9.3 T1/2: 9.9 hours SCr used: 0.69   Height: 5\' 2"  (157.5 cm) Weight: 220 lb (99.8 kg) IBW/kg (Calculated) : 50.1  Temp (24hrs), Avg:101.4 F (38.6 C), Min:100.9 F (38.3 C), Max:101.9 F (38.8 C)  Recent Labs  Lab 07/11/18 0957  WBC 11.1*  CREATININE 0.69  LATICACIDVEN 1.5    Estimated Creatinine Clearance: 95 mL/min (by C-G formula based on SCr of 0.69 mg/dL).    Allergies  Allergen Reactions  . Sulfa Antibiotics     Antimicrobials this admission: Vancomycin 6/30 >>    Microbiology results: 6/30 BCx: pending   Thank you for allowing pharmacy to be a part of this patient's care.  Ho Parisi A Laith Antonelli 07/11/2018 1:22 PM

## 2018-07-11 NOTE — ED Notes (Addendum)
ED TO INPATIENT HANDOFF REPORT  ED Nurse Name and Phone #:  Helmut Musterlicia [phone]  S Name/Age/Gender Wendy [name] 48 y.o. female Room/Bed: ED06A/ED06A  Code Status   Code Status: Not on file  Home/SNF/Other Home Patient oriented to: self, place, time and situation Is this baseline? Yes   Triage Complete: Triage complete  Chief Complaint cellulitis  Triage Note Pt here with c/o cellulitis to right leg. Was seen at [facility] on Sunday, rx antibiotics (cephalexin 500mg  x3 daily). Pt reports pain has gotten worse. Reports fever started Saturday.    Allergies Allergies  Allergen Reactions  . Sulfa Antibiotics     Level of Care/Admitting Diagnosis ED Disposition    ED Disposition Condition Comment   Admit  Hospital Area: [facility] [[id number]]  Level of Care: Med-Surg [16]  Covid Evaluation: Person Under Investigation (PUI)  Diagnosis: Cellulitis of right lower extremity [[id number]][[id number]]  Admitting Physician: Houston SirenSAINANI, VIVEK J [[id number]]  Attending Physician: Houston SirenSAINANI, VIVEK J [[id number]]  Estimated length of stay: past midnight tomorrow  Certification:: I certify this patient will need inpatient services for at least 2 midnights  PT Class (Do Not Modify): Inpatient [101]  PT Acc Code (Do Not Modify): Private [1]       B Medical/Surgery History Past Medical History:  Diagnosis Date  . Depression   . Diabetes mellitus without complication (HCC)   . Hypertension   . Thyroid disease    Past Surgical History:  Procedure Laterality Date  . ABDOMINAL HYSTERECTOMY    . CESAREAN SECTION    . NOSE SURGERY       A IV Location/Drains/Wounds Patient Lines/Drains/Airways Status   Active Line/Drains/Airways    Name:   Placement date:   Placement time:   Site:   Days:   Peripheral IV 07/11/18 Left Forearm   07/11/18    1040    Forearm   less than 1          Intake/Output Last 24 hours  Intake/Output Summary (Last 24 hours) at 07/11/2018 1218 Last data filed at  07/11/2018 1209 Gross per 24 hour  Intake 102.33 ml  Output -  Net 102.33 ml    Labs/Imaging Results for orders placed or performed during the hospital encounter of 07/11/18 (from the past 48 hour(s))  Comprehensive metabolic panel     Status: Abnormal   Collection Time: 07/11/18  9:57 AM  Result Value Ref Range   Sodium 134 (L) 135 - 145 mmol/L   Potassium 3.1 (L) 3.5 - 5.1 mmol/L   Chloride 97 (L) 98 - 111 mmol/L   CO2 26 22 - 32 mmol/L   Glucose, Bld 139 (H) 70 - 99 mg/dL   BUN 10 6 - 20 mg/dL   Creatinine, Ser 5.620.69 0.44 - 1.00 mg/dL   Calcium 8.5 (L) 8.9 - 10.3 mg/dL   Total Protein 8.0 6.5 - 8.1 g/dL   Albumin 3.6 3.5 - 5.0 g/dL   AST 16 15 - 41 U/L   ALT 21 0 - 44 U/L   Alkaline Phosphatase 64 38 - 126 U/L   Total Bilirubin 0.7 0.3 - 1.2 mg/dL   GFR calc non Af Amer >60 >60 mL/min   GFR calc Af Amer >60 >60 mL/min   Anion gap 11 5 - 15    Comment: Performed at [facility], [address] [place], [place] [address]  Lactic acid, plasma     Status: None   Collection Time: 07/11/18  9:57 AM  Result Value Ref Range   Lactic Acid, Venous 1.5 0.5 - 1.9 mmol/L    Comment: Performed at [facility], [address] [place], [place] [address]  CBC with Differential     Status: Abnormal   Collection Time: 07/11/18  9:57 AM  Result Value Ref Range   WBC 11.1 (H) 4.0 - 10.5 K/uL   RBC 5.25 (H) 3.87 - 5.11 MIL/uL   Hemoglobin 15.6 (H) 12.0 - 15.0 g/dL   HCT 60.446.1 (H) 54.036.0 - 98.146.0 %   MCV 87.8 80.0 - 100.0 fL   MCH 29.7 26.0 - 34.0 pg   MCHC 33.8 30.0 - 36.0 g/dL   RDW 19.114.2 47.811.5 - 29.515.5 %   Platelets 192 150 - 400 K/uL   nRBC 0.0 0.0 - 0.2 %   Neutrophils Relative % 83 %   Neutro Abs 9.2 (H) 1.7 - 7.7 K/uL   Lymphocytes Relative 12 %   Lymphs Abs 1.3 0.7 - 4.0 K/uL   Monocytes Relative 5 %   Monocytes Absolute 0.6 0.1 - 1.0 K/uL   Eosinophils Relative 0 %   Eosinophils Absolute 0.0 0.0 - 0.5 K/uL   Basophils Relative 0 %   Basophils Absolute 0.0 0.0  - 0.1 K/uL   Immature Granulocytes 0 %   Abs Immature Granulocytes 0.03 0.00 - 0.07 K/uL    Comment: Performed at [facility], [address] [place], [place] [address]  Protime-INR     Status: None   Collection Time: 07/11/18  9:57 AM  Result Value Ref Range   Prothrombin Time 13.8 11.4 - 15.2 seconds   INR 1.1 0.8 - 1.2    Comment: (NOTE) INR goal varies based on device and disease states. Performed at [facility], [address] [place], [place] [address]    Dg Tibia/fibula Right  Result Date: 07/11/2018 CLINICAL DATA:  Cellulitis of the right lower leg. EXAM: RIGHT TIBIA AND FIBULA - 2 VIEW COMPARISON:  None. FINDINGS: The right tibia and fibula appear normal. There is edema in the subcutaneous fat of the lower leg. Soft tissues are otherwise normal. No appreciable effusions. IMPRESSION: Subcutaneous edema.  Otherwise, normal exam. Electronically Signed   By: Francene BoyersJames  Maxwell M.D.   On: 07/11/2018 11:05    Pending Labs Unresulted Labs (From admission, onward)    Start     Ordered   07/11/18 1037  Novel Coronavirus,NAA,(SEND-OUT TO REF LAB - TAT 24-48 hrs); Hosp Order  (Asymptomatic Patients Labs)  Once,   STAT    Question:  Rule Out  Answer:  Yes   07/11/18 1036   07/11/18 0946  Blood Culture (routine x 2)  BLOOD CULTURE X 2,   STAT     07/11/18 0946   07/11/18 0946  Urinalysis, Routine w reflex microscopic  ONCE - STAT,   STAT     07/11/18 0946   07/11/18 0932  Urinalysis, Complete w Microscopic  ONCE - STAT,   STAT     07/11/18 0932   Signed and Held  HIV antibody (Routine Testing)  Once,   R     Signed and Held   Signed and Held  Basic metabolic panel  Tomorrow morning,   R     Signed and Held   Signed and Held  CBC  Tomorrow morning,   R     Signed and Held   Signed and Held  CBC  (enoxaparin (LOVENOX)    CrCl >/= 30 ml/min)  Once,  R    Comments: Baseline for enoxaparin therapy IF NOT ALREADY DRAWN.  Notify MD if PLT < 100 K.    Signed and Held    Signed and Held  Creatinine, serum  (enoxaparin (LOVENOX)    CrCl >/= 30 ml/min)  Once,   R    Comments: Baseline for enoxaparin therapy IF NOT ALREADY DRAWN.    Signed and Held   Signed and Held  Creatinine, serum  (enoxaparin (LOVENOX)    CrCl >/= 30 ml/min)  Weekly,   R    Comments: while on enoxaparin therapy    Signed and Held          Vitals/Pain Today's Vitals   07/11/18 1000 07/11/18 1103 07/11/18 1104 07/11/18 1105  BP: 129/79  128/76   Pulse: (!) 107 (!) 103 95 97  Resp: 10 17 18    Temp:      SpO2: 93% 94% 96% 96%  Weight:      Height:      PainSc:        Isolation Precautions No active isolations  Medications Medications  vancomycin (VANCOCIN) IVPB 1000 mg/200 mL premix (has no administration in time range)  vancomycin (VANCOCIN) IVPB 1000 mg/200 mL premix (1,000 mg Intravenous New Bag/Given 07/11/18 1200)  ketorolac (TORADOL) 30 MG/ML injection 30 mg (30 mg Intravenous Given 07/11/18 1209)  potassium chloride SA (K-DUR) CR tablet 40 mEq (has no administration in time range)  sodium chloride flush (NS) 0.9 % injection 3 mL (3 mLs Intravenous Given 07/11/18 1102)  cefTRIAXone (ROCEPHIN) 2 g in sodium chloride 0.9 % 100 mL IVPB ( Intravenous Stopped 07/11/18 1142)    Mobility walks Low fall risk   Focused Assessments Cardiac Assessment Handoff:   No results found for: CKTOTAL, CKMB, CKMBINDEX, TROPONINI No results found for: DDIMER Does the Patient currently have chest pain? No   , Pulmonary Assessment Handoff:  Lung sounds:  clear bil O2 Device: Room Air   Cellulitis to RLE. Pt had Rocephin already. She is supposed to get total 2G of vancomycin. 1G bag of vanc is running now, when finished start 2nd bag.   Per shift report at 1100, 1 complete set of blood cultures were sent. 2nd set only blue top was sent d/t difficulty obtaining blood. Per MD ok to start abt.     R Recommendations: See Admitting Provider Note  Report given to: Cherlyn Labella,  RN  Additional Notes:

## 2018-07-11 NOTE — H&P (Signed)
[employer] [facility] [place] [facility]    PATIENT NAME: Wendy NicelyRebecca [name]    MR#:  [id number]  DATE OF BIRTH:  04/27/1970  DATE OF ADMISSION:  07/11/2018  PRIMARY CARE PHYSICIAN: [facility]   REQUESTING/REFERRING PHYSICIAN: Dr. Jene Everyobert Kinner  CHIEF COMPLAINT:   Chief Complaint  Patient presents with  . Cellulitis    HISTORY OF PRESENT ILLNESS:  Wendy [name]  is a 48 y.o. female with a known history of diabetes, hypertension, hypothyroidism, depression, previous history of cellulitis who presents to the hospital due to right lower extremity redness swelling and pain.  Patient says she noticed a small area of redness on her right leg 2 days ago and has progressively gotten worse.  She has developed fevers as high as 103 and some chills.  She denies any shortness of breath, cough, recent travel history, loss of taste, chest pain or any other associated symptoms presently.  She presents to the hospital and was clinically diagnosed with right lower extremity cellulitis.  Hospitalist services were contacted for admission.  PAST MEDICAL HISTORY:   Past Medical History:  Diagnosis Date  . Depression   . Diabetes mellitus without complication (HCC)   . Hypertension   . Thyroid disease     PAST SURGICAL HISTORY:   Past Surgical History:  Procedure Laterality Date  . ABDOMINAL HYSTERECTOMY    . CESAREAN SECTION    . NOSE SURGERY      SOCIAL HISTORY:   Social History   Tobacco Use  . Smoking status: Current Every Day Smoker    Packs/day: 1.50    Years: 30.00    Pack years: 45.00    Types: Cigarettes  . Smokeless tobacco: Never Used  Substance Use Topics  . Alcohol use: No    FAMILY HISTORY:   Family History  Problem Relation Age of Onset  . Thrombosis Mother   . Osteoporosis Mother   . Thrombosis Father     DRUG ALLERGIES:   Allergies  Allergen Reactions  . Sulfa Antibiotics     REVIEW OF SYSTEMS:   Review of Systems   Constitutional: Positive for chills and fever. Negative for weight loss.  HENT: Negative for congestion, nosebleeds and tinnitus.   Eyes: Negative for blurred vision, double vision and redness.  Respiratory: Negative for cough, hemoptysis and shortness of breath.   Cardiovascular: Positive for leg swelling (Right leg). Negative for chest pain, orthopnea and PND.  Gastrointestinal: Positive for nausea. Negative for abdominal pain, diarrhea, melena and vomiting.  Genitourinary: Negative for dysuria, hematuria and urgency.  Musculoskeletal: Negative for falls and joint pain.  Neurological: Negative for dizziness, tingling, sensory change, focal weakness, seizures, weakness and headaches.  Endo/Heme/Allergies: Negative for polydipsia. Does not bruise/bleed easily.  Psychiatric/Behavioral: Negative for depression and memory loss. The patient is not nervous/anxious.     MEDICATIONS AT HOME:   Prior to Admission medications   Medication Sig Start Date End Date Taking? Authorizing Provider  b complex vitamins tablet Take 1 tablet by mouth daily.   Yes [provider]  cephALEXin (KEFLEX) 500 MG capsule Take 500 mg by mouth 3 (three) times daily. 07/09/18 07/18/18 Yes [provider]  cholecalciferol (VITAMIN D3) 25 MCG (1000 UT) tablet Take 1,000 Units by mouth daily.   Yes [provider]  hydrochlorothiazide (HYDRODIURIL) 25 MG tablet Take 25 mg by mouth daily.   Yes [provider]  levothyroxine (SYNTHROID) 88 MCG tablet Take 88 mcg by mouth daily.  Yes [provider]  OZEMPIC, 0.25 OR 0.5 MG/DOSE, 2 MG/1.5ML SOPN Inject 0.5 mg into the skin every Friday.   Yes [provider]  venlafaxine XR (EFFEXOR-XR) 75 MG 24 hr capsule Take 75 mg by mouth daily.   Yes [provider]      VITAL SIGNS:  Blood pressure 128/76, pulse 97, temperature (!) 101.9 F (38.8 C), resp. rate 18, height 5\' 2"  (1.575 m), weight 99.8 kg, SpO2 96 %.   PHYSICAL EXAMINATION:  Physical Exam  GENERAL:  48 y.o.-year-old patient lying in the bed with no acute distress.  EYES: Pupils equal, round, reactive to light and accommodation. No scleral icterus. Extraocular muscles intact.  HEENT: Head atraumatic, normocephalic. Oropharynx and nasopharynx clear. No oropharyngeal erythema, moist oral mucosa  NECK:  Supple, no jugular venous distention. No thyroid enlargement, no tenderness.  LUNGS: Normal breath sounds bilaterally, no wheezing, rales, rhonchi. No use of accessory muscles of respiration.  CARDIOVASCULAR: S1, S2 RRR. No murmurs, rubs, gallops, clicks.  ABDOMEN: Soft, nontender, nondistended. Bowel sounds present. No organomegaly or mass.  EXTREMITIES: +1-2 edema on Right > Left, No cyanosis, or clubbing. + 2 pedal & radial pulses b/l.   NEUROLOGIC: Cranial nerves II through XII are intact. No focal Motor or sensory deficits appreciated b/l PSYCHIATRIC: The patient is alert and oriented x 3. Good affect.  SKIN: No obvious rash, lesion, or ulcer. RLE cellulitis as shown below      LABORATORY PANEL:   CBC Recent Labs  Lab 07/11/18 0957  WBC 11.1*  HGB 15.6*  HCT 46.1*  PLT 192   ------------------------------------------------------------------------------------------------------------------  Chemistries  Recent Labs  Lab 07/11/18 0957  NA 134*  K 3.1*  CL 97*  CO2 26  GLUCOSE 139*  BUN 10  CREATININE 0.69  CALCIUM 8.5*  AST 16  ALT 21  ALKPHOS 64  BILITOT 0.7   ------------------------------------------------------------------------------------------------------------------  Cardiac Enzymes No results for input(s): TROPONINI in the last 168 hours. ------------------------------------------------------------------------------------------------------------------  RADIOLOGY:  Dg Tibia/fibula Right  Result Date: 07/11/2018 CLINICAL DATA:  Cellulitis of the right lower leg. EXAM: RIGHT TIBIA AND FIBULA - 2 VIEW  COMPARISON:  None. FINDINGS: The right tibia and fibula appear normal. There is edema in the subcutaneous fat of the lower leg. Soft tissues are otherwise normal. No appreciable effusions. IMPRESSION: Subcutaneous edema.  Otherwise, normal exam. Electronically Signed   By: Lorriane Shire M.D.   On: 07/11/2018 11:05     IMPRESSION AND PLAN:   48 year old female with past medical history of hypertension, hypothyroidism, diabetes, depression, previous history of cellulitis who presents to the hospital due to right lower extremity redness swelling and pain.  1.  Right lower extremity cellulitis-this is a cause of patient's redness swelling and pain. - We will treat the patient with IV vancomycin, follow clinically. -We will get Dopplers of the right lower extremity to rule out underlying DVT. -Patient has failed outpatient oral antibiotics.  2.  Essential hypertension-continue hydrochlorothiazide.  3.  Hypothyroidism-continue Synthroid.  4.  Depression-continue Effexor.  5.  Hypokalemia-will replace potassium orally and repeat level in the morning.    All the records are reviewed and case discussed with ED provider. Management plans discussed with the patient, family and they are in agreement.  CODE STATUS: Full code  TOTAL TIME TAKING CARE OF THIS PATIENT: 45 minutes.    Henreitta Leber M.D on 07/11/2018 at 12:12 PM  Between 7am to 6pm - Pager - [phone]  After 6pm go to [email or web address] -  password [email or web address]  [facility]  Office  [phone]  CC: Primary care physician; [facility]

## 2018-07-11 NOTE — Progress Notes (Signed)
PHARMACIST - PHYSICIAN COMMUNICATION  CONCERNING:  Enoxaparin (Lovenox) for DVT Prophylaxis    RECOMMENDATION: Patient was prescribed enoxaprin 40mg  q24 hours for VTE prophylaxis.   Filed Weights   07/11/18 0923  Weight: 220 lb (99.8 kg)    Body mass index is 40.24 kg/m.  Estimated Creatinine Clearance: 95 mL/min (by C-G formula based on SCr of 0.69 mg/dL).   Based on [place] patient is candidate for enoxaparin 40mg  every 12 hour dosing due to BMI being >40.   DESCRIPTION: Pharmacy has adjusted enoxaparin dose per [facility] policy.  Patient is now receiving enoxaparin 40mg  every 12 hours.   Tawnya Crook, PharmD Clinical Pharmacist  07/11/2018 2:50 PM

## 2018-07-11 NOTE — ED Provider Notes (Signed)
[facility] Emergency Department Provider Note   ____________________________________________    I have reviewed the triage vital signs and the nursing notes.   HISTORY  Chief Complaint Cellulitis     HPI Wendy [name] is a 48 y.o. female with history of diabetes and hypertension who presents today with complaints of skin infection.  Patient reports that she was seen in urgent care started on Keflex for cellulitis of the right lower leg, she has been taking her Keflex x3 days but she reports her symptoms have worsened significantly and she has had fevers now as high as 103.  She reports worsening redness which is extended up her leg.  No injury to the area.  No nausea or vomiting.  Describes burning pain in the leg  Past Medical History:  Diagnosis Date  . Depression   . Diabetes mellitus without complication ([place])   . Hypertension   . Thyroid disease     Patient Active Problem List   Diagnosis Date Noted  . Cellulitis of right lower extremity 07/11/2018    Past Surgical History:  Procedure Laterality Date  . ABDOMINAL HYSTERECTOMY    . CESAREAN SECTION    . NOSE SURGERY      Prior to Admission medications   Medication Sig Start Date End Date Taking? Authorizing Provider  b complex vitamins tablet Take 1 tablet by mouth daily.   Yes [provider]  cephALEXin (KEFLEX) 500 MG capsule Take 500 mg by mouth 3 (three) times daily. 07/09/18 07/18/18 Yes [provider]  cholecalciferol (VITAMIN D3) 25 MCG (1000 UT) tablet Take 1,000 Units by mouth daily.   Yes [provider]  hydrochlorothiazide (HYDRODIURIL) 25 MG tablet Take 25 mg by mouth daily.   Yes [provider]  levothyroxine (SYNTHROID) 88 MCG tablet Take 88 mcg by mouth daily.   Yes [provider]  OZEMPIC, 0.25 OR 0.5 MG/DOSE, 2 MG/1.5ML SOPN Inject 0.5 mg into the skin every Friday.   Yes [provider]  venlafaxine XR  (EFFEXOR-XR) 75 MG 24 hr capsule Take 75 mg by mouth daily.   Yes [provider]     Allergies Sulfa antibiotics  Family History  Problem Relation Age of Onset  . Thrombosis Mother   . Osteoporosis Mother   . Thrombosis Father     Social History Social History   Tobacco Use  . Smoking status: Current Every Day Smoker    Packs/day: 1.50    Years: 30.00    Pack years: 45.00    Types: Cigarettes  . Smokeless tobacco: Never Used  Substance Use Topics  . Alcohol use: No  . Drug use: Not on file    Review of Systems  Constitutional: No fever/chills Eyes: No visual changes.  ENT: No sore throat. Cardiovascular: Denies chest pain. Respiratory: Denies shortness of breath. Gastrointestinal: No abdominal pain.   Genitourinary: Negative for dysuria. Musculoskeletal: As above Skin: As above Neurological: Negative for headaches or weakness   ____________________________________________   PHYSICAL EXAM:  VITAL SIGNS: ED Triage Vitals  Enc Vitals Group     BP 07/11/18 0923 138/67     Pulse Rate 07/11/18 0923 99     Resp 07/11/18 0923 16     Temp 07/11/18 0923 (!) 101.9 F (38.8 C)     Temp src --      SpO2 07/11/18 0923 96 %     Weight 07/11/18 0923 99.8 kg (220 lb)     Height  07/11/18 0923 1.575 m (5\' 2" )     Head Circumference --      Peak Flow --      Pain Score 07/11/18 0930 10     Pain Loc --      Pain Edu? --      Excl. in GC? --     Constitutional: Alert and oriented.  Eyes: Conjunctivae are normal.   Nose: No congestion/rhinnorhea. Mouth/Throat: Mucous membranes are moist.    Cardiovascular: Normal rate, regular rhythm. Grossly normal heart sounds.  Good peripheral circulation. Respiratory: Normal respiratory effort.  No retractions. Lungs CTAB. Gastrointestinal: Soft and nontender. No distention.  No CVA tenderness. Genitourinary: deferred Musculoskeletal: No lower extremity tenderness nor edema.  Warm and well perfused Neurologic:   Normal speech and language. No gross focal neurologic deficits are appreciated.  Skin:  Skin is warm, dry and intact. No rash noted. Psychiatric: Mood and affect are normal. Speech and behavior are normal.  ____________________________________________   LABS (all labs ordered are listed, but only abnormal results are displayed)  Labs Reviewed  COMPREHENSIVE METABOLIC PANEL - Abnormal; Notable for the following components:      Result Value   Sodium 134 (*)    Potassium 3.1 (*)    Chloride 97 (*)    Glucose, Bld 139 (*)    Calcium 8.5 (*)    All other components within normal limits  CBC WITH DIFFERENTIAL/PLATELET - Abnormal; Notable for the following components:   WBC 11.1 (*)    RBC 5.25 (*)    Hemoglobin 15.6 (*)    HCT 46.1 (*)    Neutro Abs 9.2 (*)    All other components within normal limits  CULTURE, BLOOD (ROUTINE X 2)  CULTURE, BLOOD (ROUTINE X 2)  NOVEL CORONAVIRUS, NAA (HOSPITAL ORDER, SEND-OUT TO REF LAB)  LACTIC ACID, PLASMA  PROTIME-INR  URINALYSIS, COMPLETE (UACMP) WITH MICROSCOPIC  URINALYSIS, ROUTINE W REFLEX MICROSCOPIC   ____________________________________________  EKG  ED ECG REPORT I, Jene Everyobert Deandre Stansel, the attending physician, personally viewed and interpreted this ECG.  Date: 07/11/2018  Rhythm: normal sinus rhythm QRS Axis: normal Intervals: normal ST/T Wave abnormalities: normal Narrative Interpretation: no evidence of acute ischemia  ____________________________________________  RADIOLOGY  X-ray negative for subcu gas ____________________________________________   PROCEDURES  Procedure(s) performed: No  Procedures   Critical Care performed: No ____________________________________________   INITIAL IMPRESSION / ASSESSMENT AND PLAN / ED COURSE  Pertinent labs & imaging results that were available during my care of the patient were reviewed by me and considered in my medical decision making (see chart for details).  Patient  presents with cellulitis, failed outpatient treatment, now with fever, elevated heart rate, elevated white blood cell count concerning for sepsis.  We will treat with IV antibiotics admit the patient to the hospital service    ____________________________________________   FINAL CLINICAL IMPRESSION(S) / ED DIAGNOSES  Final diagnoses:  Cellulitis of right lower extremity  Sepsis, due to unspecified organism, unspecified whether acute organ dysfunction present [facility](HCC)        Note:  This document was prepared using Dragon voice recognition software and may include unintentional dictation errors.   Jene EveryKinner, Danniella Robben, MD 07/11/18 1209

## 2018-07-12 LAB — CBC
HCT: 42.4 % (ref 36.0–46.0)
Hemoglobin: 13.9 g/dL (ref 12.0–15.0)
MCH: 29.2 pg (ref 26.0–34.0)
MCHC: 32.8 g/dL (ref 30.0–36.0)
MCV: 89.1 fL (ref 80.0–100.0)
Platelets: 193 10*3/uL (ref 150–400)
RBC: 4.76 MIL/uL (ref 3.87–5.11)
RDW: 13.9 % (ref 11.5–15.5)
WBC: 7.4 10*3/uL (ref 4.0–10.5)
nRBC: 0 % (ref 0.0–0.2)

## 2018-07-12 LAB — BASIC METABOLIC PANEL
Anion gap: 9 (ref 5–15)
BUN: 19 mg/dL (ref 6–20)
CO2: 26 mmol/L (ref 22–32)
Calcium: 8.3 mg/dL — ABNORMAL LOW (ref 8.9–10.3)
Chloride: 102 mmol/L (ref 98–111)
Creatinine, Ser: 1.2 mg/dL — ABNORMAL HIGH (ref 0.44–1.00)
GFR calc Af Amer: >60 mL/min (ref >60)
GFR calc non Af Amer: 53 mL/min — ABNORMAL LOW (ref >60)
Glucose, Bld: 143 mg/dL — ABNORMAL HIGH (ref 70–99)
Potassium: 3.9 mmol/L (ref 3.5–5.1)
Sodium: 137 mmol/L (ref 135–145)

## 2018-07-12 LAB — NOVEL CORONAVIRUS, NAA (HOSP ORDER, SEND-OUT TO REF LAB; TAT 18-24 HRS): SARS-CoV-2, NAA: NOT DETECTED

## 2018-07-12 MED ORDER — SODIUM CHLORIDE 0.9 % IV SOLN
3.0000 g | Freq: Four times a day (QID) | INTRAVENOUS | Status: DC
  Administered 2018-07-12 – 2018-07-13 (×6): 3 g via INTRAVENOUS
  Filled 2018-07-12: qty 3
  Filled 2018-07-12 (×4): qty 8
  Filled 2018-07-12: qty 3
  Filled 2018-07-12 (×2): qty 8
  Filled 2018-07-12 (×4): qty 3

## 2018-07-12 MED ORDER — VANCOMYCIN HCL IN DEXTROSE 1-5 GM/200ML-% IV SOLN
1000.0000 mg | INTRAVENOUS | Status: DC
  Administered 2018-07-12: 1000 mg via INTRAVENOUS
  Filled 2018-07-12 (×2): qty 200

## 2018-07-12 NOTE — Progress Notes (Signed)
Wendy [name] at [place] NAME: Wendy [name]    MR#:  [id number]  DATE OF BIRTH:  02-Dec-1970  SUBJECTIVE:  CHIEF COMPLAINT:   Chief Complaint  Patient presents with  . Cellulitis   The patient feels better but still has erythema, swelling and tenderness on the right leg. REVIEW OF SYSTEMS:  Review of Systems  Constitutional: Negative for chills, fever and malaise/fatigue.  HENT: Negative for sore throat.   Eyes: Negative for blurred vision and double vision.  Respiratory: Negative for cough, hemoptysis, shortness of breath, wheezing and stridor.   Cardiovascular: Positive for leg swelling. Negative for chest pain, palpitations and orthopnea.  Gastrointestinal: Negative for abdominal pain, blood in stool, diarrhea, melena, nausea and vomiting.  Genitourinary: Negative for dysuria, flank pain and hematuria.  Musculoskeletal: Negative for back pain and joint pain.  Skin: Negative for rash.  Neurological: Negative for dizziness, sensory change, focal weakness, seizures, loss of consciousness, weakness and headaches.  Endo/Heme/Allergies: Negative for polydipsia.  Psychiatric/Behavioral: Negative for depression. The patient is not nervous/anxious.     DRUG ALLERGIES:   Allergies  Allergen Reactions  . Sulfa Antibiotics     Swelling to upper and lower extremities per pt.   VITALS:  Blood pressure 123/71, pulse 72, temperature 98.7 F (37.1 C), temperature source Oral, resp. rate 20, height 5\' 2"  (1.575 m), weight 99.8 kg, SpO2 92 %. PHYSICAL EXAMINATION:  Physical Exam Constitutional:      General: She is not in acute distress.    Appearance: She is obese.  HENT:     Head: Normocephalic.     Mouth/Throat:     Mouth: Mucous membranes are moist.  Eyes:     General: No scleral icterus.    Conjunctiva/sclera: Conjunctivae normal.     Pupils: Pupils are equal, round, and reactive to light.  Neck:     Musculoskeletal: Normal range  of motion and neck supple.     Vascular: No JVD.     Trachea: No tracheal deviation.  Cardiovascular:     Rate and Rhythm: Normal rate and regular rhythm.     Heart sounds: Normal heart sounds. No murmur. No gallop.   Pulmonary:     Effort: Pulmonary effort is normal. No respiratory distress.     Breath sounds: Normal breath sounds. No wheezing or rales.  Abdominal:     General: Bowel sounds are normal. There is no distension.     Palpations: Abdomen is soft.     Tenderness: There is no abdominal tenderness. There is no rebound.  Musculoskeletal: Normal range of motion.        General: Swelling and tenderness present.     Right lower leg: No edema.     Left lower leg: No edema.     Comments: Right leg erythema, swelling and tenderness.  Skin:    Findings: No erythema or rash.  Neurological:     General: No focal deficit present.     Mental Status: She is alert and oriented to person, place, and time.     Cranial Nerves: No cranial nerve deficit.  Psychiatric:        Mood and Affect: Mood normal.    LABORATORY PANEL:  Female CBC Recent Labs  Lab 07/12/18 0647  WBC 7.4  HGB 13.9  HCT 42.4  PLT 193   ------------------------------------------------------------------------------------------------------------------ Chemistries  Recent Labs  Lab 07/11/18 0957 07/12/18 0647  NA 134* 137  K 3.1* 3.9  CL 97*  102  CO2 26 26  GLUCOSE 139* 143*  BUN 10 19  CREATININE 0.69 1.20*  CALCIUM 8.5* 8.3*  AST 16  --   ALT 21  --   ALKPHOS 64  --   BILITOT 0.7  --    RADIOLOGY:  [place] Venous Img Lower Unilateral Right  Result Date: 07/11/2018 CLINICAL DATA:  48 year old female with right lower extremity pain and swelling for the past 3 days EXAM: RIGHT LOWER EXTREMITY VENOUS DOPPLER ULTRASOUND TECHNIQUE: Gray-scale sonography with graded compression, as well as color Doppler and duplex ultrasound were performed to evaluate the lower extremity deep venous systems from the level of  the common femoral vein and including the common femoral, femoral, profunda femoral, popliteal and calf veins including the posterior tibial, peroneal and gastrocnemius veins when visible. The superficial great saphenous vein was also interrogated. Spectral Doppler was utilized to evaluate flow at rest and with distal augmentation maneuvers in the common femoral, femoral and popliteal veins. COMPARISON:  None. FINDINGS: Contralateral Common Femoral Vein: Respiratory phasicity is normal and symmetric with the symptomatic side. No evidence of thrombus. Normal compressibility. Common Femoral Vein: No evidence of thrombus. Normal compressibility, respiratory phasicity and response to augmentation. Saphenofemoral Junction: No evidence of thrombus. Normal compressibility and flow on color Doppler imaging. Profunda Femoral Vein: No evidence of thrombus. Normal compressibility and flow on color Doppler imaging. Femoral Vein: No evidence of thrombus. Normal compressibility, respiratory phasicity and response to augmentation. Popliteal Vein: No evidence of thrombus. Normal compressibility, respiratory phasicity and response to augmentation. Calf Veins: No evidence of thrombus. Normal compressibility and flow on color Doppler imaging. Superficial Great Saphenous Vein: No evidence of thrombus. Normal compressibility. Venous Reflux:  None. Other Findings: Slightly prominent superficial inguinal lymphadenopathy, likely reactive. IMPRESSION: 1. No evidence of deep venous thrombosis. 2. Prominent superficial inguinal lymph nodes, most likely reactive. Electronically Signed   By: Malachy MoanHeath  McCullough M.D.   On: 07/11/2018 14:56   ASSESSMENT AND PLAN:   48 year old female with past medical history of hypertension, hypothyroidism, diabetes, depression, previous history of cellulitis who presents to the hospital due to right lower extremity redness swelling and pain.  1.  Right lower extremity cellulitis-this is a cause of  patient's redness swelling and pain. Continue IV vancomycin, added Unasyn.   Dopplers of the right lower extremity to ruled out DVT. -Patient has failed outpatient oral antibiotics.  2.  Essential hypertension-continue hydrochlorothiazide.  3.  Hypothyroidism-continue Synthroid.  4.  Depression-continue Effexor.  5.  Hypokalemia- improved with potassium supplement.  All the records are reviewed and case discussed with Care Management/Social Worker. Management plans discussed with the patient, family and they are in agreement.  CODE STATUS: Full Code  TOTAL TIME TAKING CARE OF THIS PATIENT: 32 minutes.   More than 50% of the time was spent in counseling/coordination of care: YES  POSSIBLE D/C IN 2 DAYS, DEPENDING ON CLINICAL CONDITION.   Shaune PollackQing Emeline Simpson M.D on 07/12/2018 at 11:27 AM  Between 7am to 6pm - Pager - [phone]  After 6pm go to [email or web address] - Therapist, nutritionalpassword EPAS [facility]  [facility] [place] [facility]

## 2018-07-12 NOTE — Consult Note (Signed)
Pharmacy Antibiotic Note  Wendy [name] is a 48 y.o. female admitted on 07/11/2018 with cellulitis.  Pharmacy has been consulted for Unasyn dosing. Patient also on vancomycin.  Plan: Unasyn 3 g IV q6h  With increase in SCr from 0.69 to 1.2, will decrease vancomycin to 1000 mg IV q24h and continue to monitor renal function closely.  Vancomycin 1000 mg IV Q 24 hrs. Goal AUC 400-550. Expected AUC: 476 SCr used: 1.2   Height: 5\' 2"  (157.5 cm) Weight: 220 lb (99.8 kg) IBW/kg (Calculated) : 50.1  Temp (24hrs), Avg:100.2 F (37.9 C), Min:98.7 F (37.1 C), Max:101.9 F (38.8 C)  Recent Labs  Lab 07/11/18 0957 07/12/18 0647  WBC 11.1* 7.4  CREATININE 0.69 1.20*  LATICACIDVEN 1.5  --     Estimated Creatinine Clearance: 63.4 mL/min (A) (by C-G formula based on SCr of 1.2 mg/dL (H)).    Allergies  Allergen Reactions  . Sulfa Antibiotics     Swelling to upper and lower extremities per pt.    Antimicrobials this admission: Vancomycin 6/30 >>  Unasyn 7/1 >>   Microbiology results: 6/30 BCx: NG<24hr   Thank you for allowing pharmacy to be a part of this patient's care.  Tawnya Crook, PharmD Clinical Pharmacist 07/12/2018 7:56 AM

## 2018-07-13 LAB — CREATININE, SERUM
Creatinine, Ser: 1.17 mg/dL — ABNORMAL HIGH (ref 0.44–1.00)
GFR calc Af Amer: >60 mL/min (ref >60)
GFR calc non Af Amer: 55 mL/min — ABNORMAL LOW (ref >60)

## 2018-07-13 LAB — HIV ANTIBODY (ROUTINE TESTING W REFLEX): HIV Screen 4th Generation wRfx: NONREACTIVE

## 2018-07-13 MED ORDER — DOCUSATE SODIUM 100 MG PO CAPS
100.0000 mg | ORAL_CAPSULE | Freq: Two times a day (BID) | ORAL | Status: DC
  Administered 2018-07-13: 09:00:00 100 mg via ORAL

## 2018-07-13 MED ORDER — SENNA 8.6 MG PO TABS: 1.0000 | ORAL_TABLET | Freq: Every day | ORAL | Status: DC | PRN

## 2018-07-13 MED ORDER — HYDROCODONE-ACETAMINOPHEN 5-325 MG PO TABS: 1.0000 | ORAL_TABLET | Freq: Four times a day (QID) | ORAL | 0 refills | Status: AC | PRN

## 2018-07-13 MED ORDER — BISACODYL 5 MG PO TBEC: 5.0000 mg | DELAYED_RELEASE_TABLET | Freq: Every day | ORAL | Status: DC | PRN

## 2018-07-13 MED ORDER — CLINDAMYCIN HCL 300 MG PO CAPS: 600.0000 mg | ORAL_CAPSULE | Freq: Three times a day (TID) | ORAL | 0 refills | Status: AC

## 2018-07-13 NOTE — Progress Notes (Signed)
[place] at [place] was admitted to the Hospital on 07/11/2018 and Discharged  07/13/2018 and should be excused from work/school   for 10  days starting 07/11/2018 , may return to work/school without any restrictions.  Demetrios Loll M.D on 07/13/2018,at 2:51 PM  [place] [facility]  [phone]

## 2018-07-13 NOTE — Progress Notes (Signed)
Patient discharged home per MD order. Prescriptions given to patient. All discharge instructions given and all questions answered.  Work note given to patient.

## 2018-07-13 NOTE — Discharge Summary (Signed)
[employer] [facility] [place] at [facility]   PATIENT NAME: Wendy NicelyRebecca [name]    MR#:  [id number]  DATE OF BIRTH:  [birth year]  DATE OF ADMISSION:  07/11/2018   ADMITTING PHYSICIAN: Houston SirenVivek J Sainani, MD  DATE OF DISCHARGE: 07/13/2018 PRIMARY CARE PHYSICIAN: [facility]   ADMISSION DIAGNOSIS:  Cellulitis of right lower extremity [L03.115] Pain and swelling of right lower extremity [M79.604, M79.89] Sepsis, due to unspecified organism, unspecified whether acute organ dysfunction present (HCC) [A41.9] DISCHARGE DIAGNOSIS:  Active Problems:   Cellulitis of right lower extremity  SECONDARY DIAGNOSIS:   Past Medical History:  Diagnosis Date  . Depression   . Diabetes mellitus without complication (HCC)   . Hypertension   . Thyroid disease    HOSPITAL COURSE:   48 year old female with past medical history of hypertension, hypothyroidism, diabetes, depression, previous history of cellulitis who presents to the hospital due to right lower extremity redness swelling and pain.  1. Right lower extremity cellulitis-this is a cause of patient's redness swelling and pain. Continue IV vancomycin, added Unasyn.   Dopplers of the right lower extremity to ruled out DVT. -Patient has failed outpatient oral antibiotics. The patient has much better erythema and swelling.  But still has tenderness.  Change to p.o. clindamycin for 7 more days.  Follow-up PCP.  2. Essential hypertension-continue hydrochlorothiazide.  3. Hypothyroidism-continue Synthroid.  4. Depression-continue Effexor.  5. Hypokalemia- improved with potassium supplement.  DISCHARGE CONDITIONS:  Stable, discharged home today. CONSULTS OBTAINED:   DRUG ALLERGIES:   Allergies  Allergen Reactions  . Sulfa Antibiotics     Swelling to upper and lower extremities per pt.   DISCHARGE MEDICATIONS:   Allergies as of 07/13/2018      Reactions   Sulfa Antibiotics    Swelling to upper and  lower extremities per pt.      Medication List    STOP taking these medications   cephALEXin 500 MG capsule Commonly known as: KEFLEX     TAKE these medications   b complex vitamins tablet Take 1 tablet by mouth daily.   cholecalciferol 25 MCG (1000 UT) tablet Commonly known as: VITAMIN D3 Take 1,000 Units by mouth daily.   clindamycin 300 MG capsule Commonly known as: CLEOCIN Take 2 capsules (600 mg total) by mouth 3 (three) times daily for 7 days.   hydrochlorothiazide 25 MG tablet Commonly known as: HYDRODIURIL Take 25 mg by mouth daily.   HYDROcodone-acetaminophen 5-325 MG tablet Commonly known as: NORCO/VICODIN Take 1 tablet by mouth every 6 (six) hours as needed for up to 3 days for moderate pain or severe pain.   levothyroxine 88 MCG tablet Commonly known as: SYNTHROID Take 88 mcg by mouth daily.   Ozempic (0.25 or 0.5 MG/DOSE) 2 MG/1.5ML Sopn Generic drug: Semaglutide(0.25 or 0.5MG /DOS) Inject 0.5 mg into the skin every Friday.   venlafaxine XR 75 MG 24 hr capsule Commonly known as: EFFEXOR-XR Take 75 mg by mouth daily.        DISCHARGE INSTRUCTIONS:  See AVS.   If you experience worsening of your admission symptoms, develop shortness of breath, life threatening emergency, suicidal or homicidal thoughts you must seek medical attention immediately by calling 911 or calling your MD immediately  if symptoms less severe.  You Must read complete instructions/literature along with all the possible adverse reactions/side effects for all the Medicines you take and that have been prescribed to you. Take any new Medicines after you have completely understood and accpet all  the possible adverse reactions/side effects.   Please note  You were cared for by a hospitalist during your hospital stay. If you have any questions about your discharge medications or the care you received while you were in the hospital after you are discharged, you can call the unit and asked  to speak with the hospitalist on call if the hospitalist that took care of you is not available. Once you are discharged, your primary care physician will handle any further medical issues. Please note that NO REFILLS for any discharge medications will be authorized once you are discharged, as it is imperative that you return to your primary care physician (or establish a relationship with a primary care physician if you do not have one) for your aftercare needs so that they can reassess your need for medications and monitor your lab values.    On the day of Discharge:  VITAL SIGNS:  Blood pressure 117/64, pulse 84, temperature 99.4 F (37.4 C), temperature source Oral, resp. rate 16, height 5\' 2"  (1.575 m), weight 99.8 kg, SpO2 90 %. PHYSICAL EXAMINATION:  GENERAL:  48 y.o.-year-old patient lying in the bed with no acute distress.  EYES: Pupils equal, round, reactive to light and accommodation. No scleral icterus. Extraocular muscles intact.  HEENT: Head atraumatic, normocephalic. Oropharynx and nasopharynx clear.  NECK:  Supple, no jugular venous distention. No thyroid enlargement, no tenderness.  LUNGS: Normal breath sounds bilaterally, no wheezing, rales,rhonchi or crepitation. No use of accessory muscles of respiration.  CARDIOVASCULAR: S1, S2 normal. No murmurs, rubs, or gallops.  ABDOMEN: Soft, non-tender, non-distended. Bowel sounds present. No organomegaly or mass.  EXTREMITIES: No pedal edema, cyanosis, or clubbing.  Right leg erythema, swelling and tenderness.  Improving. NEUROLOGIC: Cranial nerves II through XII are intact. Muscle strength 5/5 in all extremities. Sensation intact. Gait not checked.  PSYCHIATRIC: The patient is alert and oriented x 3.  SKIN: No obvious rash, lesion, or ulcer.  DATA REVIEW:   CBC Recent Labs  Lab 07/12/18 0647  WBC 7.4  HGB 13.9  HCT 42.4  PLT 193    Chemistries  Recent Labs  Lab 07/11/18 0957 07/12/18 0647 07/13/18 0737  NA 134* 137   --   K 3.1* 3.9  --   CL 97* 102  --   CO2 26 26  --   GLUCOSE 139* 143*  --   BUN 10 19  --   CREATININE 0.69 1.20* 1.17*  CALCIUM 8.5* 8.3*  --   AST 16  --   --   ALT 21  --   --   ALKPHOS 64  --   --   BILITOT 0.7  --   --      Microbiology Results  Results for orders placed or performed during the hospital encounter of 07/11/18  Blood Culture (routine x 2)     Status: None (Preliminary result)   Collection Time: 07/11/18  9:57 AM   Specimen: BLOOD  Result Value Ref Range Status   Specimen Description BLOOD BLOOD LEFT ARM  Final   Special Requests   Final    BOTTLES DRAWN AEROBIC ONLY Blood Culture adequate volume   Culture   Final    NO GROWTH 2 DAYS Performed at [facility], [address], [place], [place] [address]    Report Status PENDING  Incomplete  Blood Culture (routine x 2)     Status: None (Preliminary result)   Collection Time: 07/11/18  9:58 AM   Specimen:  BLOOD  Result Value Ref Range Status   Specimen Description BLOOD LEFT AC  Final   Special Requests   Final    BOTTLES DRAWN AEROBIC AND ANAEROBIC Blood Culture results may not be optimal due to an excessive volume of blood received in culture bottles   Culture   Final    NO GROWTH 2 DAYS Performed at [facility], [address] [place], [place] [address]    Report Status PENDING  Incomplete  Novel Coronavirus,NAA,(SEND-OUT TO REF LAB - TAT 24-48 hrs); Hosp Order     Status: None   Collection Time: 07/11/18 11:49 AM   Specimen: Nasopharyngeal Swab; Respiratory  Result Value Ref Range Status   SARS-CoV-2, NAA NOT DETECTED NOT DETECTED Final    Comment: (NOTE) This test was developed and its performance characteristics determined by [facility]. This test has not been FDA cleared or approved. This test has been authorized by FDA under an Emergency Use Authorization (EUA). This test is only authorized for the duration of time the declaration that circumstances exist  justifying the authorization of the emergency use of in vitro diagnostic tests for detection of SARS-CoV-2 virus and/or diagnosis of COVID-19 infection under section 564(b)(1) of the Act, 21 U.S.C. 604VWU-9(W)(1360bbb-3(b)(1), unless the authorization is terminated or revoked sooner. When diagnostic testing is negative, the possibility of a false negative result should be considered in the context of a patient's recent exposures and the presence of clinical signs and symptoms consistent with COVID-19. An individual without symptoms of COVID-19 and who is not shedding SARS-CoV-2 virus would expect to have a negative (not detected) result in this assay. Performed  At: [facility] [place] [address] [place], [place] [id number] Jolene SchimkeNagendra Sanjai MD AO:[id number]:[phone]    Coronavirus Source NASOPHARYNGEAL  Final    Comment: Performed at [facility], [address] [place], [place] [address]    RADIOLOGY:  No results found.   Management plans discussed with the patient, family and they are in agreement.  CODE STATUS: Full Code   TOTAL TIME TAKING CARE OF THIS PATIENT: 32 minutes.    Shaune PollackQing Jaliah Foody M.D on 07/13/2018 at 11:46 AM  Between 7am to 6pm - Pager - [phone]  After 6pm go to [email or web address] - Scientist, research (life sciences)password EPAS [facility]  [facility] [place] [facility]  Office  [phone]  CC: Primary care physician; [facility]   Note: This dictation was prepared with Dragon dictation along with smaller phrase technology. Any transcriptional errors that result from this process are unintentional.

## 2018-07-16 DIAGNOSIS — R509 Fever, unspecified: Secondary | ICD-10-CM | POA: Diagnosis not present

## 2018-07-16 DIAGNOSIS — E119 Type 2 diabetes mellitus without complications: Secondary | ICD-10-CM | POA: Diagnosis not present

## 2018-07-16 DIAGNOSIS — R69 Illness, unspecified: Secondary | ICD-10-CM | POA: Diagnosis not present

## 2018-07-16 DIAGNOSIS — E039 Hypothyroidism, unspecified: Secondary | ICD-10-CM | POA: Diagnosis not present

## 2018-07-16 DIAGNOSIS — R5383 Other fatigue: Secondary | ICD-10-CM | POA: Diagnosis not present

## 2018-07-16 DIAGNOSIS — M7989 Other specified soft tissue disorders: Secondary | ICD-10-CM | POA: Diagnosis not present

## 2018-07-16 DIAGNOSIS — L03115 Cellulitis of right lower limb: Secondary | ICD-10-CM | POA: Diagnosis not present

## 2018-07-16 DIAGNOSIS — I10 Essential (primary) hypertension: Secondary | ICD-10-CM | POA: Diagnosis not present

## 2018-07-16 DIAGNOSIS — R110 Nausea: Secondary | ICD-10-CM | POA: Diagnosis not present

## 2018-07-16 DIAGNOSIS — L03116 Cellulitis of left lower limb: Secondary | ICD-10-CM | POA: Diagnosis not present

## 2018-07-16 LAB — CULTURE, BLOOD (ROUTINE X 2)
Culture: NO GROWTH
Culture: NO GROWTH
Special Requests: ADEQUATE

## 2018-07-20 DIAGNOSIS — L03115 Cellulitis of right lower limb: Secondary | ICD-10-CM | POA: Diagnosis not present

## 2018-08-03 DIAGNOSIS — L03115 Cellulitis of right lower limb: Secondary | ICD-10-CM | POA: Diagnosis not present

## 2018-08-03 DIAGNOSIS — E118 Type 2 diabetes mellitus with unspecified complications: Secondary | ICD-10-CM | POA: Diagnosis not present

## 2018-08-03 DIAGNOSIS — R69 Illness, unspecified: Secondary | ICD-10-CM | POA: Diagnosis not present

## 2018-08-10 ENCOUNTER — Other Ambulatory Visit: Payer: Self-pay | Admitting: Family Medicine

## 2018-08-10 DIAGNOSIS — Z1231 Encounter for screening mammogram for malignant neoplasm of breast: Secondary | ICD-10-CM

## 2018-10-18 DIAGNOSIS — Z23 Encounter for immunization: Secondary | ICD-10-CM | POA: Diagnosis not present

## 2018-11-27 DIAGNOSIS — Z20828 Contact with and (suspected) exposure to other viral communicable diseases: Secondary | ICD-10-CM | POA: Diagnosis not present

## 2018-12-05 DIAGNOSIS — H9201 Otalgia, right ear: Secondary | ICD-10-CM | POA: Diagnosis not present

## 2018-12-05 DIAGNOSIS — R05 Cough: Secondary | ICD-10-CM | POA: Diagnosis not present

## 2018-12-11 ENCOUNTER — Emergency Department: Payer: Worker's Compensation

## 2018-12-11 ENCOUNTER — Other Ambulatory Visit: Payer: Self-pay

## 2018-12-11 ENCOUNTER — Emergency Department
Admission: EM | Admit: 2018-12-11 | Discharge: 2018-12-11 | Disposition: A | Discharge status: Home / Self Care | Payer: Worker's Compensation | Attending: Student in an Organized Health Care Education/Training Program | Admitting: Student in an Organized Health Care Education/Training Program

## 2018-12-11 DIAGNOSIS — I10 Essential (primary) hypertension: Secondary | ICD-10-CM | POA: Diagnosis not present

## 2018-12-11 DIAGNOSIS — Y929 Unspecified place or not applicable: Secondary | ICD-10-CM | POA: Diagnosis not present

## 2018-12-11 DIAGNOSIS — W19XXXA Unspecified fall, initial encounter: Secondary | ICD-10-CM

## 2018-12-11 DIAGNOSIS — Z79899 Other long term (current) drug therapy: Secondary | ICD-10-CM | POA: Diagnosis not present

## 2018-12-11 DIAGNOSIS — G44319 Acute post-traumatic headache, not intractable: Secondary | ICD-10-CM | POA: Diagnosis not present

## 2018-12-11 DIAGNOSIS — Y939 Activity, unspecified: Secondary | ICD-10-CM | POA: Insufficient documentation

## 2018-12-11 DIAGNOSIS — F17210 Nicotine dependence, cigarettes, uncomplicated: Secondary | ICD-10-CM | POA: Insufficient documentation

## 2018-12-11 DIAGNOSIS — S40011A Contusion of right shoulder, initial encounter: Secondary | ICD-10-CM | POA: Diagnosis not present

## 2018-12-11 DIAGNOSIS — M7541 Impingement syndrome of right shoulder: Secondary | ICD-10-CM

## 2018-12-11 DIAGNOSIS — Y990 Civilian activity done for income or pay: Secondary | ICD-10-CM | POA: Diagnosis not present

## 2018-12-11 DIAGNOSIS — E119 Type 2 diabetes mellitus without complications: Secondary | ICD-10-CM | POA: Insufficient documentation

## 2018-12-11 DIAGNOSIS — W010XXA Fall on same level from slipping, tripping and stumbling without subsequent striking against object, initial encounter: Secondary | ICD-10-CM | POA: Insufficient documentation

## 2018-12-11 DIAGNOSIS — S4991XA Unspecified injury of right shoulder and upper arm, initial encounter: Secondary | ICD-10-CM | POA: Diagnosis present

## 2018-12-11 DIAGNOSIS — S40021A Contusion of right upper arm, initial encounter: Secondary | ICD-10-CM

## 2018-12-11 LAB — GLUCOSE, CAPILLARY: Glucose-Capillary: 137 mg/dL — ABNORMAL HIGH (ref 70–99)

## 2018-12-11 MED ORDER — HYDROCODONE-ACETAMINOPHEN 5-325 MG PO TABS: 1.0000 | ORAL_TABLET | ORAL | 0 refills | Status: DC | PRN

## 2018-12-11 MED ORDER — MELOXICAM 15 MG PO TABS: 15.0000 mg | ORAL_TABLET | Freq: Every day | ORAL | 0 refills | Status: AC

## 2018-12-11 MED ORDER — KETOROLAC TROMETHAMINE 30 MG/ML IJ SOLN
30.0000 mg | Freq: Once | INTRAMUSCULAR | Status: AC
  Administered 2018-12-11: 30 mg via INTRAMUSCULAR
  Filled 2018-12-11: qty 1

## 2018-12-11 MED ORDER — ORPHENADRINE CITRATE 30 MG/ML IJ SOLN
60.0000 mg | Freq: Once | INTRAMUSCULAR | Status: AC
  Administered 2018-12-11: 22:00:00 60 mg via INTRAMUSCULAR
  Filled 2018-12-11: qty 2

## 2018-12-11 MED ORDER — METHOCARBAMOL 500 MG PO TABS: 500.0000 mg | ORAL_TABLET | Freq: Four times a day (QID) | ORAL | 0 refills | Status: AC

## 2018-12-11 MED ORDER — OXYCODONE-ACETAMINOPHEN 5-325 MG PO TABS
1.0000 | ORAL_TABLET | Freq: Once | ORAL | Status: AC
  Administered 2018-12-11: 1 via ORAL
  Filled 2018-12-11: qty 1

## 2018-12-11 MED ORDER — ONDANSETRON 8 MG PO TBDP
8.0000 mg | ORAL_TABLET | Freq: Once | ORAL | Status: AC
  Administered 2018-12-11: 22:00:00 8 mg via ORAL
  Filled 2018-12-11: qty 1

## 2018-12-11 NOTE — ED Triage Notes (Signed)
Pt states she tripped and fell in the laundry room at work this evening and is having right sided neck, shoulder pain radiating into the right elbow.

## 2018-12-11 NOTE — ED Notes (Signed)
Pt's employer requests [employer] for WC.  Consent signed, specimen obtained and released to lab via chain of custody protocol.  Specimen id no [id number]

## 2018-12-11 NOTE — ED Provider Notes (Signed)
[facility] Emergency Department Provider Note  ____________________________________________  Time seen: Approximately 8:02 PM  I have reviewed the triage vital signs and the nursing notes.   HISTORY  Chief Complaint Fall    HPI Wendy [name] is a 48 y.o. female who presents the emergency department complaining of neck, shoulder pain, humeral pain after a fall.  Patient states that she was at work, she believes she slipped on some floor falling and landing on her right shoulder.  She denies hitting her head or losing consciousness.  She is complaining of shoulder pain is the most significant source of pain followed by her arm and then her neck.  The patient sister is also here emergency department states that she picked her up from work to bring her to the emergency department.  She states that her sister, the patient, was confused on the ride to the emergency department.  Patient is a diabetic and has not checked her blood sugar.  When requestion, patient is now unsure whether she may have hit her head or not.        Past Medical History:  Diagnosis Date  . Depression   . Diabetes mellitus without complication (HCC)   . Hypertension   . Thyroid disease     Patient Active Problem List   Diagnosis Date Noted  . Cellulitis of right lower extremity 07/11/2018    Past Surgical History:  Procedure Laterality Date  . ABDOMINAL HYSTERECTOMY    . CESAREAN SECTION    . NOSE SURGERY      Prior to Admission medications   Medication Sig Start Date End Date Taking? Authorizing Provider  b complex vitamins tablet Take 1 tablet by mouth daily.    [provider]  cholecalciferol (VITAMIN D3) 25 MCG (1000 UT) tablet Take 1,000 Units by mouth daily.    [provider]  hydrochlorothiazide (HYDRODIURIL) 25 MG tablet Take 25 mg by mouth daily.    [provider]  HYDROcodone-acetaminophen (NORCO/VICODIN) 5-325 MG tablet Take 1 tablet  by mouth every 4 (four) hours as needed for moderate pain. 12/11/18   Cuthriell, Delorise RoyalsJonathan D, PA-C  levothyroxine (SYNTHROID) 88 MCG tablet Take 88 mcg by mouth daily.    [provider]  meloxicam (MOBIC) 15 MG tablet Take 1 tablet (15 mg total) by mouth daily. 12/11/18   Cuthriell, Delorise RoyalsJonathan D, PA-C  methocarbamol (ROBAXIN) 500 MG tablet Take 1 tablet (500 mg total) by mouth 4 (four) times daily. 12/11/18   Cuthriell, Delorise RoyalsJonathan D, PA-C  OZEMPIC, 0.25 OR 0.5 MG/DOSE, 2 MG/1.5ML SOPN Inject 0.5 mg into the skin every Friday.    [provider]  venlafaxine XR (EFFEXOR-XR) 75 MG 24 hr capsule Take 75 mg by mouth daily.    [provider]    Allergies Sulfa antibiotics and Tramadol  Family History  Problem Relation Age of Onset  . Thrombosis Mother   . Osteoporosis Mother   . Thrombosis Father     Social History Social History   Tobacco Use  . Smoking status: Current Every Day Smoker    Packs/day: 1.50    Years: 30.00    Pack years: 45.00    Types: Cigarettes  . Smokeless tobacco: Never Used  Substance Use Topics  . Alcohol use: No  . Drug use: Not on file     Review of Systems  Constitutional: No fever/chills Eyes: No visual changes.  Cardiovascular: no chest pain. Respiratory: no cough. No SOB. Gastrointestinal: No abdominal pain.  No nausea, no vomiting.   Musculoskeletal: Positive for right-sided neck, shoulder and arm pain Skin: Negative for rash, abrasions, lacerations, ecchymosis. Neurological: Negative for headaches, focal weakness or numbness. 10-point ROS otherwise negative.  ____________________________________________   PHYSICAL EXAM:  VITAL SIGNS: ED Triage Vitals  Enc Vitals Group     BP 12/11/18 1904 137/75     Pulse Rate 12/11/18 1904 73     Resp 12/11/18 1904 18     Temp 12/11/18 1904 98.9 F (37.2 C)     Temp Source 12/11/18 1904 Oral     SpO2 12/11/18 1904 94 %     Weight 12/11/18 1858 220 lb (99.8 kg)     Height  12/11/18 1858 5\' 2"  (1.575 m)     Head Circumference --      Peak Flow --      Pain Score 12/11/18 1857 8     Pain Loc --      Pain Edu? --      Excl. in [place]? --      Constitutional: Alert and oriented. Well appearing and in no acute distress. Eyes: Conjunctivae are normal. PERRL. EOMI. Head: Atraumatic. ENT:      Ears:       Nose: No congestion/rhinnorhea.      Mouth/Throat: Mucous membranes are moist.  Neck: No stridor.  No midline cervical spine tenderness to palpation.  Patient is diffusely tender to palpation along left trapezius muscle.  No palpable abnormality or deficit.  Radial pulse and sensation intact distally.  Cardiovascular: Normal rate, regular rhythm. Normal S1 and S2.  Good peripheral circulation. Respiratory: Normal respiratory effort without tachypnea or retractions. Lungs CTAB. Good air entry to the bases with no decreased or absent breath sounds. Musculoskeletal: Limited range of motion to the right upper extremity due to pain.  No gross deformities appreciated.  Visualization of the right shoulder reveals no deformity.  Patient is tender to palpation over the distal clavicle, and the acromioclavicular joint space and over the humeral head.  Patient is also tender to palpation mid humerus.  No other significant tenderness to palpation of the right upper extremity or right shoulder.  Passive range of motion intact.  Examination of the elbow and wrist is unremarkable.  Radial pulse intact distally.  Sensation intact distally. Neurologic:  Normal speech and language. No gross focal neurologic deficits are appreciated.  Cranial nerves II to XII grossly intact. Skin:  Skin is warm, dry and intact. No rash noted. Psychiatric: Mood and affect are normal. Speech and behavior are normal. Patient exhibits appropriate insight and judgement.   ____________________________________________   LABS (all labs ordered are listed, but only abnormal results are displayed)  Labs  Reviewed  GLUCOSE, CAPILLARY - Abnormal; Notable for the following components:      Result Value   Glucose-Capillary 137 (*)    All other components within normal limits  CBG MONITORING, ED   ____________________________________________  EKG   ____________________________________________  RADIOLOGY I personally viewed and evaluated these images as part of my medical decision making, as well as reviewing the written report by the radiologist.  Dg Shoulder Right  Result Date: 12/11/2018 CLINICAL DATA:  Fall with pain EXAM: RIGHT SHOULDER - 2+ VIEW COMPARISON:  None. FINDINGS: No fracture or malalignment. Mild to moderate AC joint degenerative change. The right lung apex is clear IMPRESSION: No acute osseous abnormality Electronically Signed   By: Donavan Foil M.D.   On: 12/11/2018 19:31   Ct Head Wo Contrast  Result Date: 12/11/2018 CLINICAL DATA:  48 year old female with fall. EXAM: CT HEAD WITHOUT CONTRAST TECHNIQUE: Contiguous axial images were obtained from the base of the skull through the vertex without intravenous contrast. COMPARISON:  Maxillofacial CT dated 04/29/2016. FINDINGS: Brain: No evidence of acute infarction, hemorrhage, hydrocephalus, extra-axial collection or mass lesion/mass effect. Vascular: No hyperdense vessel or unexpected calcification. Skull: Normal. Negative for fracture or focal lesion. Sinuses/Orbits: No acute finding. Other: None IMPRESSION: Normal unenhanced CT of the brain. Electronically Signed   By: Elgie Collard M.D.   On: 12/11/2018 20:59   Ct Cervical Spine Wo Contrast  Result Date: 12/11/2018 CLINICAL DATA:  Tripped, fall. EXAM: CT CERVICAL SPINE WITHOUT CONTRAST TECHNIQUE: Multidetector CT imaging of the cervical spine was performed without intravenous contrast. Multiplanar CT image reconstructions were also generated. COMPARISON:  None. FINDINGS: Alignment: Normal alignment Skull base and vertebrae: No acute fracture. No primary bone lesion or  focal pathologic process. Soft tissues and spinal canal: No prevertebral fluid or swelling. No visible canal hematoma. Disc levels:  Maintained Upper chest: Negative Other: None IMPRESSION: No bony abnormality. Electronically Signed   By: Charlett Nose M.D.   On: 12/11/2018 19:29   Dg Humerus Right  Result Date: 12/11/2018 CLINICAL DATA:  48 year old female with fall and right upper extremity pain. EXAM: RIGHT HUMERUS - 2+ VIEW COMPARISON:  Right shoulder radiograph dated 12/11/2018. FINDINGS: There is no acute fracture or dislocation. Mild osteopenia. No significant arthritic changes. The soft tissues are unremarkable. IMPRESSION: No acute fracture or dislocation. Electronically Signed   By: Elgie Collard M.D.   On: 12/11/2018 19:42    ____________________________________________    PROCEDURES  Procedure(s) performed:    Procedures    Medications  oxyCODONE-acetaminophen (PERCOCET/ROXICET) 5-325 MG per tablet 1 tablet (has no administration in time range)  ketorolac (TORADOL) 30 MG/ML injection 30 mg (has no administration in time range)  orphenadrine (NORFLEX) injection 60 mg (has no administration in time range)  ondansetron (ZOFRAN-ODT) disintegrating tablet 8 mg (has no administration in time range)     ____________________________________________   INITIAL IMPRESSION / ASSESSMENT AND PLAN / ED COURSE  Pertinent labs & imaging results that were available during my care of the patient were reviewed by me and considered in my medical decision making (see chart for details).  Review of the [place] CSRS was performed in accordance of the [facility] prior to dispensing any controlled drugs.           Patient's diagnosis is consistent with impingement of the shoulder.  Patient presents emergency department after what apparently was a mechanical fall at work.  Patient did have some confusion after the accident as well.  Patient denied hitting her head or loss of consciousness but  imaging of the head and neck were obtained.  No traumatic findings in the head or cervical spine.  X-rays of the shoulder and humerus revealed no acute traumatic findings.  Based on physical exam, symptoms I suspect that patient has a contusion over the acromioclavicular joint space causing impingement symptoms.  Patient will be placed on anti-inflammatory, pain medication and muscle relaxer for symptom relief.  Follow-up with orthopedics..Patient is given ED precautions to return to the ED for any worsening or new symptoms.     ____________________________________________  FINAL CLINICAL IMPRESSION(S) / ED DIAGNOSES  Final diagnoses:  Fall, initial encounter  Contusion of right shoulder, initial encounter  Impingement syndrome of right shoulder  Contusion of right upper extremity, initial encounter  Acute post-traumatic headache, not intractable  NEW MEDICATIONS STARTED DURING THIS VISIT:  ED Discharge Orders         Ordered    meloxicam (MOBIC) 15 MG tablet  Daily     12/11/18 2125    methocarbamol (ROBAXIN) 500 MG tablet  4 times daily     12/11/18 2125    HYDROcodone-acetaminophen (NORCO/VICODIN) 5-325 MG tablet  Every 4 hours PRN     12/11/18 2125              This chart was dictated using voice recognition software/Dragon. Despite best efforts to proofread, errors can occur which can change the meaning. Any change was purely unintentional.    Racheal Patches, PA-C 12/11/18 2126    Willy Eddy, MD 12/11/18 2141

## 2018-12-19 ENCOUNTER — Other Ambulatory Visit: Payer: Self-pay | Admitting: Orthopedic Surgery

## 2018-12-20 ENCOUNTER — Other Ambulatory Visit: Payer: Self-pay | Admitting: Orthopedic Surgery

## 2018-12-20 DIAGNOSIS — M25511 Pain in right shoulder: Secondary | ICD-10-CM

## 2018-12-21 ENCOUNTER — Ambulatory Visit
Admission: RE | Admit: 2018-12-21 | Discharge: 2018-12-21 | Disposition: A | Discharge status: Home / Self Care | Payer: Worker's Compensation | Source: Ambulatory Visit | Attending: Orthopedic Surgery | Admitting: Orthopedic Surgery

## 2018-12-21 ENCOUNTER — Other Ambulatory Visit: Payer: Self-pay | Admitting: Orthopedic Surgery

## 2018-12-21 DIAGNOSIS — M25511 Pain in right shoulder: Secondary | ICD-10-CM

## 2019-02-09 DIAGNOSIS — R69 Illness, unspecified: Secondary | ICD-10-CM | POA: Diagnosis not present

## 2019-02-09 DIAGNOSIS — E785 Hyperlipidemia, unspecified: Secondary | ICD-10-CM | POA: Diagnosis not present

## 2019-02-09 DIAGNOSIS — E119 Type 2 diabetes mellitus without complications: Secondary | ICD-10-CM | POA: Diagnosis not present

## 2019-02-09 DIAGNOSIS — Z1389 Encounter for screening for other disorder: Secondary | ICD-10-CM | POA: Diagnosis not present

## 2019-02-09 DIAGNOSIS — E039 Hypothyroidism, unspecified: Secondary | ICD-10-CM | POA: Diagnosis not present

## 2019-02-09 DIAGNOSIS — I10 Essential (primary) hypertension: Secondary | ICD-10-CM | POA: Diagnosis not present

## 2019-02-09 DIAGNOSIS — E118 Type 2 diabetes mellitus with unspecified complications: Secondary | ICD-10-CM | POA: Diagnosis not present

## 2019-02-13 DIAGNOSIS — I82812 Embolism and thrombosis of superficial veins of left lower extremities: Secondary | ICD-10-CM

## 2019-02-13 DIAGNOSIS — L03116 Cellulitis of left lower limb: Secondary | ICD-10-CM | POA: Diagnosis not present

## 2019-02-13 HISTORY — DX: Embolism and thrombosis of superficial veins of left lower extremity: I82.812

## 2019-02-26 ENCOUNTER — Other Ambulatory Visit: Payer: Self-pay

## 2019-02-26 ENCOUNTER — Encounter: Payer: Self-pay | Admitting: Orthopedic Surgery

## 2019-03-01 ENCOUNTER — Other Ambulatory Visit: Payer: Self-pay

## 2019-03-01 ENCOUNTER — Other Ambulatory Visit
Admission: RE | Admit: 2019-03-01 | Discharge: 2019-03-01 | Disposition: A | Discharge status: Home / Self Care | Payer: 59 | Source: Ambulatory Visit | Attending: Orthopedic Surgery | Admitting: Orthopedic Surgery

## 2019-03-01 DIAGNOSIS — Z01812 Encounter for preprocedural laboratory examination: Secondary | ICD-10-CM | POA: Diagnosis present

## 2019-03-01 DIAGNOSIS — Z20822 Contact with and (suspected) exposure to covid-19: Secondary | ICD-10-CM | POA: Insufficient documentation

## 2019-03-01 LAB — SARS CORONAVIRUS 2 (TAT 6-24 HRS): SARS Coronavirus 2: NEGATIVE

## 2019-03-02 ENCOUNTER — Other Ambulatory Visit: Payer: Self-pay | Admitting: Orthopedic Surgery

## 2019-03-02 NOTE — Anesthesia Preprocedure Evaluation (Addendum)
Anesthesia Evaluation  Patient identified by MRN, date of birth, ID band Patient awake    Reviewed: Allergy & Precautions, NPO status , Patient's Chart, lab work & pertinent test results  History of Anesthesia Complications Negative for: history of anesthetic complications  Airway Mallampati: II  TM Distance: >3 FB Neck ROM: Full    Dental   Pulmonary Current Smoker,    breath sounds clear to auscultation       Cardiovascular hypertension, (-) angina(-) DOE  Rhythm:Regular Rate:Normal     Neuro/Psych  Headaches, Seizures - (x1 at birth, none since, not on antiepileptics),  PSYCHIATRIC DISORDERS Depression    GI/Hepatic neg GERD  ,  Endo/Other  diabetesHypothyroidism Morbid obesity (BMI 41)  Renal/GU      Musculoskeletal   Abdominal   Peds  Hematology   Anesthesia Other Findings   Reproductive/Obstetrics                            Anesthesia Physical Anesthesia Plan  ASA: III  Anesthesia Plan: General   Post-op Pain Management:  Regional for Post-op pain   Induction: Intravenous  PONV Risk Score and Plan: 3 and Treatment may vary due to age or medical condition, Ondansetron, Dexamethasone and Midazolam  Airway Management Planned: LMA  Additional Equipment:   Intra-op Plan:   Post-operative Plan: Extubation in OR  Informed Consent: I have reviewed the patients History and Physical, chart, labs and discussed the procedure including the risks, benefits and alternatives for the proposed anesthesia with the patient or authorized representative who has indicated his/her understanding and acceptance.       Plan Discussed with: CRNA and Anesthesiologist  Anesthesia Plan Comments:         Anesthesia Quick Evaluation

## 2019-03-05 ENCOUNTER — Other Ambulatory Visit: Payer: Self-pay

## 2019-03-05 ENCOUNTER — Ambulatory Visit
Admission: RE | Admit: 2019-03-05 | Discharge: 2019-03-05 | Disposition: A | Discharge status: Home / Self Care | Payer: Worker's Compensation | Attending: Orthopedic Surgery | Admitting: Orthopedic Surgery

## 2019-03-05 ENCOUNTER — Ambulatory Visit: Payer: Worker's Compensation | Admitting: Anesthesiology

## 2019-03-05 ENCOUNTER — Encounter
Admission: RE | Disposition: A | Discharge status: Home / Self Care | Payer: Self-pay | Source: Home / Self Care | Attending: Orthopedic Surgery

## 2019-03-05 ENCOUNTER — Encounter: Payer: Self-pay | Admitting: Orthopedic Surgery

## 2019-03-05 DIAGNOSIS — Z7984 Long term (current) use of oral hypoglycemic drugs: Secondary | ICD-10-CM | POA: Diagnosis not present

## 2019-03-05 DIAGNOSIS — E079 Disorder of thyroid, unspecified: Secondary | ICD-10-CM | POA: Diagnosis not present

## 2019-03-05 DIAGNOSIS — I10 Essential (primary) hypertension: Secondary | ICD-10-CM | POA: Insufficient documentation

## 2019-03-05 DIAGNOSIS — M19011 Primary osteoarthritis, right shoulder: Secondary | ICD-10-CM | POA: Diagnosis not present

## 2019-03-05 DIAGNOSIS — Z791 Long term (current) use of non-steroidal anti-inflammatories (NSAID): Secondary | ICD-10-CM | POA: Insufficient documentation

## 2019-03-05 DIAGNOSIS — Z79890 Hormone replacement therapy (postmenopausal): Secondary | ICD-10-CM | POA: Insufficient documentation

## 2019-03-05 DIAGNOSIS — M7551 Bursitis of right shoulder: Secondary | ICD-10-CM | POA: Diagnosis not present

## 2019-03-05 DIAGNOSIS — E119 Type 2 diabetes mellitus without complications: Secondary | ICD-10-CM | POA: Diagnosis not present

## 2019-03-05 DIAGNOSIS — Z79899 Other long term (current) drug therapy: Secondary | ICD-10-CM | POA: Diagnosis not present

## 2019-03-05 DIAGNOSIS — F17210 Nicotine dependence, cigarettes, uncomplicated: Secondary | ICD-10-CM | POA: Diagnosis not present

## 2019-03-05 DIAGNOSIS — Z6841 Body Mass Index (BMI) 40.0 and over, adult: Secondary | ICD-10-CM | POA: Diagnosis not present

## 2019-03-05 DIAGNOSIS — M7521 Bicipital tendinitis, right shoulder: Secondary | ICD-10-CM | POA: Diagnosis not present

## 2019-03-05 DIAGNOSIS — F329 Major depressive disorder, single episode, unspecified: Secondary | ICD-10-CM | POA: Insufficient documentation

## 2019-03-05 DIAGNOSIS — M7541 Impingement syndrome of right shoulder: Secondary | ICD-10-CM | POA: Insufficient documentation

## 2019-03-05 HISTORY — DX: Family history of other specified conditions: Z84.89

## 2019-03-05 HISTORY — DX: Simple chronic bronchitis: J41.0

## 2019-03-05 HISTORY — DX: Unspecified convulsions: R56.9

## 2019-03-05 HISTORY — DX: Migraine, unspecified, not intractable, without status migrainosus: G43.909

## 2019-03-05 HISTORY — DX: Hypothyroidism, unspecified: E03.9

## 2019-03-05 HISTORY — PX: SHOULDER ARTHROSCOPY: SHX128

## 2019-03-05 LAB — GLUCOSE, CAPILLARY
Glucose-Capillary: 124 mg/dL — ABNORMAL HIGH (ref 70–99)
Glucose-Capillary: 102 mg/dL — ABNORMAL HIGH (ref 70–99)

## 2019-03-05 SURGERY — ARTHROSCOPY, SHOULDER
Anesthesia: General | Site: Shoulder | Laterality: Right

## 2019-03-05 MED ORDER — MIDAZOLAM HCL 2 MG/2ML IJ SOLN
INTRAMUSCULAR | Status: DC | PRN
  Administered 2019-03-05: 2 mg via INTRAVENOUS

## 2019-03-05 MED ORDER — BUPIVACAINE LIPOSOME 1.3 % IJ SUSP
INTRAMUSCULAR | Status: DC | PRN
  Administered 2019-03-05: 266 mg

## 2019-03-05 MED ORDER — DEXAMETHASONE SODIUM PHOSPHATE 4 MG/ML IJ SOLN
INTRAMUSCULAR | Status: DC | PRN
  Administered 2019-03-05: 4 mg via INTRAVENOUS

## 2019-03-05 MED ORDER — HYDROMORPHONE HCL 1 MG/ML IJ SOLN: 0.2500 mg | INTRAMUSCULAR | Status: DC | PRN

## 2019-03-05 MED ORDER — MEPERIDINE HCL 25 MG/ML IJ SOLN: 6.2500 mg | INTRAMUSCULAR | Status: DC | PRN

## 2019-03-05 MED ORDER — CHLORHEXIDINE GLUCONATE 4 % EX LIQD: 60.0000 mL | Freq: Once | CUTANEOUS | Status: DC

## 2019-03-05 MED ORDER — OXYCODONE HCL 5 MG/5ML PO SOLN: 5.0000 mg | Freq: Once | ORAL | Status: DC | PRN

## 2019-03-05 MED ORDER — PROMETHAZINE HCL 25 MG/ML IJ SOLN: 6.2500 mg | INTRAMUSCULAR | Status: DC | PRN

## 2019-03-05 MED ORDER — CEFAZOLIN SODIUM-DEXTROSE 2-4 GM/100ML-% IV SOLN
2.0000 g | INTRAVENOUS | Status: AC
  Administered 2019-03-05: 13:00:00 2 g via INTRAVENOUS

## 2019-03-05 MED ORDER — LACTATED RINGERS IV SOLN: INTRAVENOUS | Status: DC

## 2019-03-05 MED ORDER — EPINEPHRINE (ANAPHYLAXIS) 30 MG/30ML IJ SOLN
INTRAMUSCULAR | Status: DC | PRN
  Administered 2019-03-05: 1 mg

## 2019-03-05 MED ORDER — FENTANYL CITRATE (PF) 100 MCG/2ML IJ SOLN
INTRAMUSCULAR | Status: DC | PRN
  Administered 2019-03-05: 100 ug via INTRAVENOUS

## 2019-03-05 MED ORDER — LACTATED RINGERS IV SOLN: 100.0000 mL/h | INTRAVENOUS | Status: DC

## 2019-03-05 MED ORDER — ONDANSETRON HCL 4 MG/2ML IJ SOLN
INTRAMUSCULAR | Status: DC | PRN
  Administered 2019-03-05: 4 mg via INTRAVENOUS

## 2019-03-05 MED ORDER — HYDROCODONE-ACETAMINOPHEN 5-325 MG PO TABS: 1.0000 | ORAL_TABLET | ORAL | 0 refills | Status: AC | PRN

## 2019-03-05 MED ORDER — OXYCODONE HCL 5 MG PO TABS: 5.0000 mg | ORAL_TABLET | Freq: Once | ORAL | Status: DC | PRN

## 2019-03-05 MED ORDER — LIDOCAINE HCL (CARDIAC) PF 100 MG/5ML IV SOSY
PREFILLED_SYRINGE | INTRAVENOUS | Status: DC | PRN
  Administered 2019-03-05: 40 mg via INTRATRACHEAL

## 2019-03-05 MED ORDER — BUPIVACAINE HCL (PF) 0.5 % IJ SOLN
INTRAMUSCULAR | Status: DC | PRN
  Administered 2019-03-05: 20 mL

## 2019-03-05 MED ORDER — PROPOFOL 10 MG/ML IV BOLUS
INTRAVENOUS | Status: DC | PRN
  Administered 2019-03-05: 30 mg via INTRAVENOUS
  Administered 2019-03-05: 130 mg via INTRAVENOUS

## 2019-03-05 MED ORDER — GLYCOPYRROLATE 0.2 MG/ML IJ SOLN
INTRAMUSCULAR | Status: DC | PRN
  Administered 2019-03-05: .1 mg via INTRAVENOUS

## 2019-03-05 SURGICAL SUPPLY — 38 items
ADAPTER IRRIG TUBE 2 SPIKE SOL (ADAPTER) ×4 IMPLANT
BLADE FULL RADIUS 3.5 (BLADE) ×2 IMPLANT
BLADE INCISOR PLUS 4.5 (BLADE) ×2 IMPLANT
CANNULA SHOULDER 7CM (CANNULA) ×2 IMPLANT
CHLORAPREP W/TINT 26 (MISCELLANEOUS) ×2 IMPLANT
COOLER POLAR GLACIER W/PUMP (MISCELLANEOUS) ×2 IMPLANT
COVER WAND RF STERILE (DRAPES) ×2 IMPLANT
DRAPE IMP U-DRAPE 54X76 (DRAPES) ×3 IMPLANT
DRAPE SHEET LG 3/4 BI-LAMINATE (DRAPES) ×2 IMPLANT
DRAPE STERI 35X30 U-POUCH (DRAPES) ×2 IMPLANT
GAUZE SPONGE 4X4 12PLY STRL (GAUZE/BANDAGES/DRESSINGS) ×2 IMPLANT
GAUZE XEROFORM 1X8 LF (GAUZE/BANDAGES/DRESSINGS) ×2 IMPLANT
GLOVE BIOGEL PI IND STRL 8 (GLOVE) ×1 IMPLANT
GLOVE BIOGEL PI INDICATOR 8 (GLOVE) ×2
GLOVE SURG ORTHO 8.0 STRL STRW (GLOVE) ×2 IMPLANT
GOWN STRL REUS W/ TWL LRG LVL3 (GOWN DISPOSABLE) ×1 IMPLANT
GOWN STRL REUS W/ TWL XL LVL3 (GOWN DISPOSABLE) ×1 IMPLANT
GOWN STRL REUS W/TWL LRG LVL3 (GOWN DISPOSABLE) ×1
GOWN STRL REUS W/TWL XL LVL3 (GOWN DISPOSABLE) ×1
IV LACTATED RINGER IRRG 3000ML (IV SOLUTION) ×6
IV LR IRRIG 3000ML ARTHROMATIC (IV SOLUTION) ×6 IMPLANT
KIT STABILIZATION SHOULDER (MISCELLANEOUS) ×2 IMPLANT
KIT TURNOVER KIT A (KITS) ×2 IMPLANT
MANIFOLD NEPTUNE II (INSTRUMENTS) ×3 IMPLANT
MASK FACE SPIDER DISP (MASK) ×2 IMPLANT
MAT ABSORB  FLUID 56X50 GRAY (MISCELLANEOUS) ×2
MAT ABSORB FLUID 56X50 GRAY (MISCELLANEOUS) ×1 IMPLANT
NDL SAFETY ECLIPSE 18X1.5 (NEEDLE) ×1 IMPLANT
NEEDLE HYPO 18GX1.5 SHARP (NEEDLE) ×1
NEEDLE HYPO 22GX1.5 SAFETY (NEEDLE) ×2 IMPLANT
PACK ARTHROSCOPY SHOULDER (MISCELLANEOUS) ×2 IMPLANT
SLING ARM LRG DEEP (SOFTGOODS) ×1 IMPLANT
SUT ETHILON 4 0 PS 2 18 (SUTURE) ×1 IMPLANT
SYR 10ML LL (SYRINGE) ×2 IMPLANT
TAPE MICROFOAM 4IN (TAPE) ×2 IMPLANT
TUBING ARTHRO INFLOW-ONLY STRL (TUBING) ×2 IMPLANT
TUBING CONNECTING 10 (TUBING) ×2 IMPLANT
WAND WEREWOLF FLOW 90D (MISCELLANEOUS) ×2 IMPLANT

## 2019-03-05 NOTE — Discharge Instructions (Signed)
Wear sling at all times, including sleep.  You will need to use the sling for a total of 2 weeks following surgery.  Do not try and lift your arm up or away from your body for any reason.   Keep the dressing dry.  You may remove bandage in 3 days.  You may place Band-Aids over top of the incisions.  May shower once dressing is removed in 3 days.  Remove sling carefully only for showers, leaving arm down by your side while in the shower.  +++ Make sure to take some pain medication this evening before you fall asleep, in preparation for the nerve block wearing off in the middle of the night.  If the the pain medication causes itching, or is too strong, try taking a single tablet at a time, or combining with Benadryl.  You may be most comfortable sleeping in a recliner.  If you do sleep in near bed, placed pillows behind the shoulder that have the operation to support it.     General Anesthesia, Adult, Care After This sheet gives you information about how to care for yourself after your procedure. Your health care provider may also give you more specific instructions. If you have problems or questions, contact your health care provider. What can I expect after the procedure? After the procedure, the following side effects are common:  Pain or discomfort at the IV site.  Nausea.  Vomiting.  Sore throat.  Trouble concentrating.  Feeling cold or chills.  Weak or tired.  Sleepiness and fatigue.  Soreness and body aches. These side effects can affect parts of the body that were not involved in surgery. Follow these instructions at home:  For at least 24 hours after the procedure:  Have a responsible adult stay with you. It is important to have someone help care for you until you are awake and alert.  Rest as needed.  Do not: ? Participate in activities in which you could fall or become injured. ? Drive. ? Use heavy machinery. ? Drink alcohol. ? Take sleeping pills or  medicines that cause drowsiness. ? Make important decisions or sign legal documents. ? Take care of children on your own. Eating and drinking  Follow any instructions from your health care provider about eating or drinking restrictions.  When you feel hungry, start by eating small amounts of foods that are soft and easy to digest (bland), such as toast. Gradually return to your regular diet.  Drink enough fluid to keep your urine pale yellow.  If you vomit, rehydrate by drinking water, juice, or clear broth. General instructions  If you have sleep apnea, surgery and certain medicines can increase your risk for breathing problems. Follow instructions from your health care provider about wearing your sleep device: ? Anytime you are sleeping, including during daytime naps. ? While taking prescription pain medicines, sleeping medicines, or medicines that make you drowsy.  Return to your normal activities as told by your health care provider. Ask your health care provider what activities are safe for you.  Take over-the-counter and prescription medicines only as told by your health care provider.  If you smoke, do not smoke without supervision.  Keep all follow-up visits as told by your health care provider. This is important. Contact a health care provider if:  You have nausea or vomiting that does not get better with medicine.  You cannot eat or drink without vomiting.  You have pain that does not get better with medicine.  You are unable to pass urine.  You develop a skin rash.  You have a fever.  You have redness around your IV site that gets worse. Get help right away if:  You have difficulty breathing.  You have chest pain.  You have blood in your urine or stool, or you vomit blood. Summary  After the procedure, it is common to have a sore throat or nausea. It is also common to feel tired.  Have a responsible adult stay with you for the first 24 hours after general  anesthesia. It is important to have someone help care for you until you are awake and alert.  When you feel hungry, start by eating small amounts of foods that are soft and easy to digest (bland), such as toast. Gradually return to your regular diet.  Drink enough fluid to keep your urine pale yellow.  Return to your normal activities as told by your health care provider. Ask your health care provider what activities are safe for you. This information is not intended to replace advice given to you by your health care provider. Make sure you discuss any questions you have with your health care provider. Document Revised: 12/31/2016 Document Reviewed: 08/13/2016 Elsevier Patient Education  2020 [employer].

## 2019-03-05 NOTE — Anesthesia Procedure Notes (Signed)
Procedure Name: LMA Insertion Date/Time: 03/05/2019 1:28 PM Performed by: Jimmy Picket, CRNA Pre-anesthesia Checklist: Patient identified, Emergency Drugs available, Suction available, Timeout performed and Patient being monitored Patient Re-evaluated:Patient Re-evaluated prior to induction Oxygen Delivery Method: Circle system utilized Preoxygenation: Pre-oxygenation with 100% oxygen Induction Type: IV induction LMA: LMA inserted LMA Size: 4.0 Number of attempts: 1 Placement Confirmation: positive ETCO2 and breath sounds checked- equal and bilateral Tube secured with: Tape

## 2019-03-05 NOTE — Transfer of Care (Signed)
Immediate Anesthesia Transfer of Care Note  Patient: Wendy [name]  Procedure(s) Performed: ARTHROSCOPY SHOULDER; Distal Clavicle Decompression & Debridement (Right Shoulder)  Patient Location: PACU  Anesthesia Type: General  Level of Consciousness: awake, alert  and patient cooperative  Airway and Oxygen Therapy: Patient Spontanous Breathing and Patient connected to supplemental oxygen  Post-op Assessment: Post-op Vital signs reviewed, Patient's Cardiovascular Status Stable, Respiratory Function Stable, Patent Airway and No signs of Nausea or vomiting  Post-op Vital Signs: Reviewed and stable  Complications: No apparent anesthesia complications

## 2019-03-05 NOTE — Anesthesia Postprocedure Evaluation (Signed)
Anesthesia Post Note  Patient: Wendy [name]  Procedure(s) Performed: ARTHROSCOPY SHOULDER; Distal Clavicle Decompression & Debridement (Right Shoulder)     Patient location during evaluation: PACU Anesthesia Type: General Level of consciousness: awake and alert Pain management: pain level controlled Vital Signs Assessment: post-procedure vital signs reviewed and stable Respiratory status: spontaneous breathing, nonlabored ventilation, respiratory function stable and patient connected to nasal cannula oxygen Cardiovascular status: blood pressure returned to baseline and stable Postop Assessment: no apparent nausea or vomiting Anesthetic complications: no    Destine Ambroise A  Jaquetta Currier

## 2019-03-05 NOTE — H&P (Signed)
The patient has been re-examined, and the chart reviewed, and there have been no interval changes to the documented history and physical.  Plan a right shoulder scope today.  Anesthesia is consulted regarding a peripheral nerve block for post-operative pain.  The risks, benefits, and alternatives have been discussed at length, and the patient is willing to proceed.    

## 2019-03-05 NOTE — Op Note (Addendum)
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    I discussed the risks and benefits of surgery. The risks include but are not limited to infection, bleeding requiring blood transfusion, nerve or blood vessel injury, joint stiffness or loss of motion, persistent pain, weakness or instability, malunion, nonunion and hardware failure and the need for further surgery. Medical risks include but are not limited to DVT and pulmonary embolism, myocardial infarction, stroke, pneumonia, respiratory failure and death. Patient understood these risks and wished to proceed.   OPERATIVE FINDINGS: The biceps tendon origin had extensive synovitis and degenerative fraying. There was mild articular sided fraying of the supraspinatus but no evidence of high-grade partial or full-thickness tear. Patient had a severe subacromial bursitis and advanced acromioclavicular joint arthrosis.  OPERATIVE PROCEDURE: The patient was met in the preoperative area. The left shoulder was signed with the word yes and my initials according the hospital's correct site of surgery protocol.  History and physical was updated. Patient was brought to the operating room where he underwent interscalene block and general anesthesia. The patient was placed in a beachchair position.  A spider arm  positioner was used for this case. Examination under anesthesia revealed no limitation of motion or instability with load shift testing. The patient had a negative sulcus sign.  Patient was prepped and draped in a sterile fashion. A timeout was performed to verify the patient's name, date of birth, medical record number, correct site of surgery and correct procedure to be performed there was also used to verify the patient received antibiotics that all appropriate instruments, implants and radiographs studies were available in the room. Once all in attendance were in agreement case began.  Bony landmarks were drawn out with a surgical marker along with proposed arthroscopy incisions. These were pre-injected with 1% lidocaine plain. An 11 blade was used to establish a posterior portal through which the arthroscope was placed in the glenohumeral joint. A full diagnostic examination of the shoulder was performed. Please see findings for a complete report. A biceps tenotomy was then performed with electrocautery and the synovitis, degenerative labrum and biceps stump were debrided with the shaver. The small articular sided tear was also debrided with the shaver.  The arthroscope was then placed in the subacromial space.  Extensive bursitis was encountered. A lateral portal was established with an 18-gauge spinal needle for localization. A 90 ArthroCare wand and shaver blade were used to form an extensive subdeltoid and subacromial bursectomy. There was no evidence of a bursal sided tear of the supra or infraspinatus.   A subacromial decompression was performed using a 4.0 mm Acromionizer from the lateral portal. The 4.0 mm Acromionizer was then placed through the anterior portal.  A distal clavicle excision was performed by resecting 8 mm of bone using the burr. Subacromial space was then copiously irrigated to remove all osseous debris. Final arthroscopic images were taken. Arthroscopic images were then  removed.  Skin  closure for the arthroscopic incisions was performed with 4-0 nylon.   0.25% Marcaine plain was then injected into the subacromial space for postoperative pain control. A dry sterile dressing was applied.  The patient was placed in a sling.  All sharp and it instrument counts were correct at the conclusion of the case. I was scrubbed and present for the entire case. I spoke with the patient's family postoperatively to let them know the case had been performed without complication and the patient was stable in recovery room.

## 2019-03-05 NOTE — Anesthesia Procedure Notes (Signed)
Anesthesia Regional Block: Interscalene brachial plexus block   Pre-Anesthetic Checklist: ,, timeout performed, Correct Patient, Correct Site, Correct Laterality, Correct Procedure, Correct Position, site marked, Risks and benefits discussed,  Surgical consent,  Pre-op evaluation,  At surgeon's request and post-op pain management  Laterality: Right  Prep: chloraprep       Needles:  Injection technique: Single-shot  Needle Type: Stimiplex     Needle Length: 10cm  Needle Gauge: 21     Additional Needles:   Procedures:,,,, ultrasound used (permanent image in chart),,,,  Narrative:  Injection made incrementally with aspirations every 5 mL.  Performed by: Personally  Anesthesiologist: Hadassah Rana A, MD  Additional Notes: Functioning IV was confirmed and monitors applied. Ultrasound guidance: relevant anatomy identified, needle position confirmed, local anesthetic spread visualized around nerve(s)., vascular puncture avoided.  Image printed for medical record.  Negative aspiration and no paresthesias; incremental administration of local anesthetic. The patient tolerated the procedure well. Vitals signes recorded in RN notes.      

## 2019-03-06 ENCOUNTER — Encounter: Payer: Self-pay | Admitting: *Deleted

## 2019-03-22 DIAGNOSIS — E119 Type 2 diabetes mellitus without complications: Secondary | ICD-10-CM | POA: Diagnosis not present

## 2019-09-24 DIAGNOSIS — M5441 Lumbago with sciatica, right side: Secondary | ICD-10-CM | POA: Diagnosis not present

## 2019-09-24 DIAGNOSIS — Z1389 Encounter for screening for other disorder: Secondary | ICD-10-CM | POA: Diagnosis not present

## 2019-10-23 ENCOUNTER — Other Ambulatory Visit: Payer: Self-pay | Admitting: *Deleted

## 2019-10-23 NOTE — Patient Outreach (Signed)
[facility] Customer service manager [facility]) Care Management [facility] CM Telephone Outreach, insurance referral- new patient  10/23/2019  Wendy [name] 10/11/1970 [id number]  Possibly successful outreach to Wendy [name], referred to [facility] RN CM 10/16/19 by insurance provider for HTN initiative.  Identified myself, purpose of call; female person answering phone declined providing HIPAA identifiers and hung phone up as I was explaining purpose of call.  Plan:  Will make patient inactive with [facility] CM and send successful patient outreach letter   Wendy Pina, RN, BSN, CCRN [facility] Coordinator [facility] Care Management  [phone]

## 2019-11-02 DIAGNOSIS — E039 Hypothyroidism, unspecified: Secondary | ICD-10-CM | POA: Diagnosis not present

## 2019-11-02 DIAGNOSIS — Z23 Encounter for immunization: Secondary | ICD-10-CM | POA: Diagnosis not present

## 2019-11-02 DIAGNOSIS — E781 Pure hyperglyceridemia: Secondary | ICD-10-CM | POA: Diagnosis not present

## 2019-11-02 DIAGNOSIS — E785 Hyperlipidemia, unspecified: Secondary | ICD-10-CM | POA: Diagnosis not present

## 2019-11-02 DIAGNOSIS — E119 Type 2 diabetes mellitus without complications: Secondary | ICD-10-CM | POA: Diagnosis not present

## 2019-11-02 DIAGNOSIS — I10 Essential (primary) hypertension: Secondary | ICD-10-CM | POA: Diagnosis not present

## 2019-11-08 ENCOUNTER — Other Ambulatory Visit: Payer: Self-pay | Admitting: Family Medicine

## 2019-11-08 DIAGNOSIS — Z1231 Encounter for screening mammogram for malignant neoplasm of breast: Secondary | ICD-10-CM

## 2019-12-27 DIAGNOSIS — E039 Hypothyroidism, unspecified: Secondary | ICD-10-CM | POA: Diagnosis not present

## 2019-12-27 DIAGNOSIS — I10 Essential (primary) hypertension: Secondary | ICD-10-CM | POA: Diagnosis not present

## 2019-12-27 DIAGNOSIS — E785 Hyperlipidemia, unspecified: Secondary | ICD-10-CM | POA: Diagnosis not present

## 2019-12-27 DIAGNOSIS — R7309 Other abnormal glucose: Secondary | ICD-10-CM | POA: Diagnosis not present

## 2019-12-27 DIAGNOSIS — E119 Type 2 diabetes mellitus without complications: Secondary | ICD-10-CM | POA: Diagnosis not present

## 2020-01-19 ENCOUNTER — Other Ambulatory Visit: Payer: 59

## 2020-01-19 DIAGNOSIS — Z20822 Contact with and (suspected) exposure to covid-19: Secondary | ICD-10-CM

## 2020-01-22 LAB — NOVEL CORONAVIRUS, NAA: SARS-CoV-2, NAA: DETECTED — AB

## 2020-06-22 IMAGING — MR MR SHOULDER*R* W/O CM
4 of 5 series · 18 of 40 positions shown · non-contrast
Comparison: Plain films left shoulder 12/11/2018

CLINICAL DATA: Right shoulder pain and limited range of motion
since a trip and fall 12/11/2018. Subsequent encounter.

EXAM:
MRI OF THE RIGHT SHOULDER WITHOUT CONTRAST
TECHNIQUE: Multiplanar, multisequence MR imaging of the shoulder was performed.
No intravenous contrast was administered.

[Series 9: PD fat-sat · axial · right · 4.0mm · 0.44mm/px · z∈[-28,+39]mm · 8 of 18 slices shown (1 of 2)]
[im 1/18]
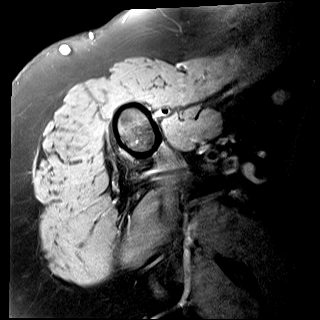
[im 3/18]
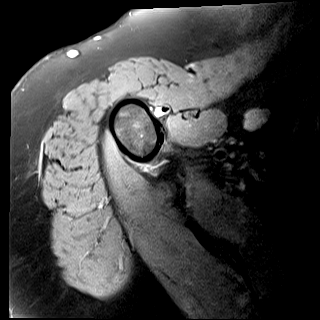
[im 5/18]
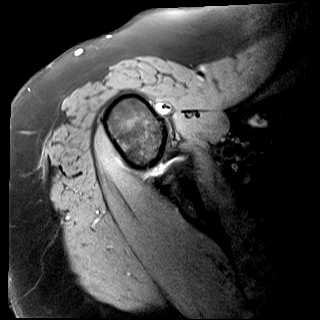
[im 8/18]
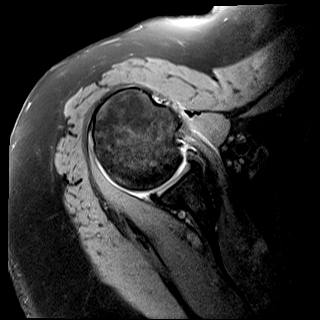
[im 10/18]
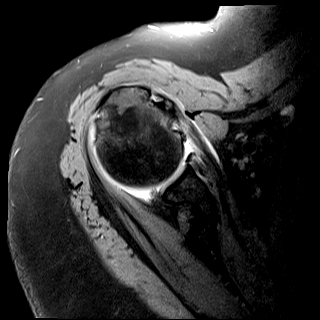
[im 13/18]
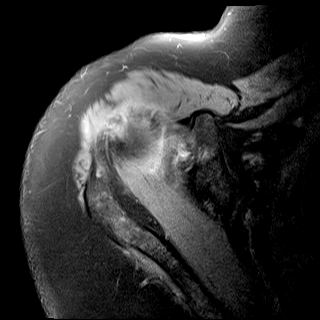
[im 15/18]
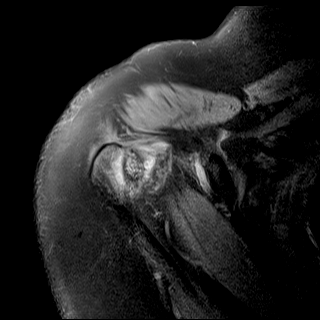
[im 18/18]
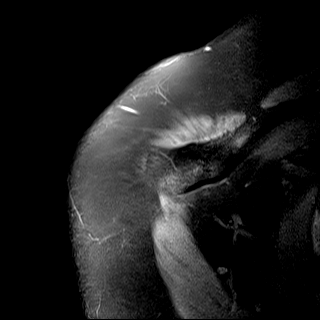

[Series 10: T2 fat-sat · oblique · right · 4.0mm · 0.23mm/px · 3 of 18 slices shown (1 of 2)]
[im 3/18]
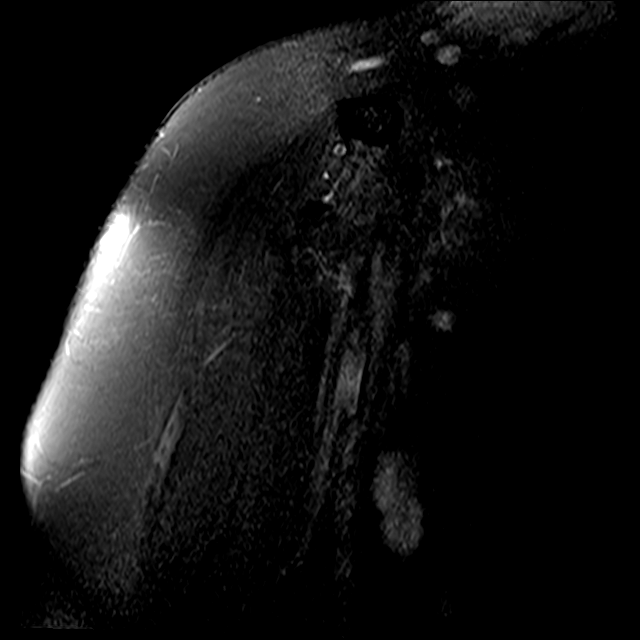
[im 10/18]
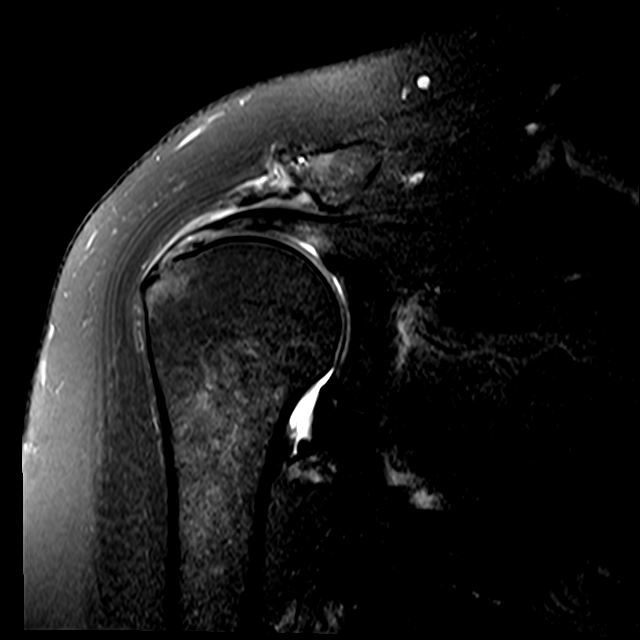
[im 15/18]
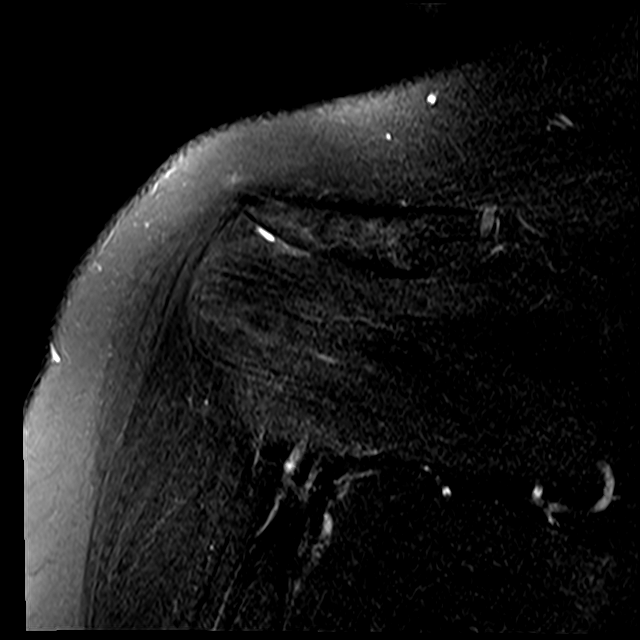

[Series 11: PD fat-sat · oblique · right · 4.0mm · 0.23mm/px · 4 of 18 slices shown (2 of 2)]
[im 1/18]
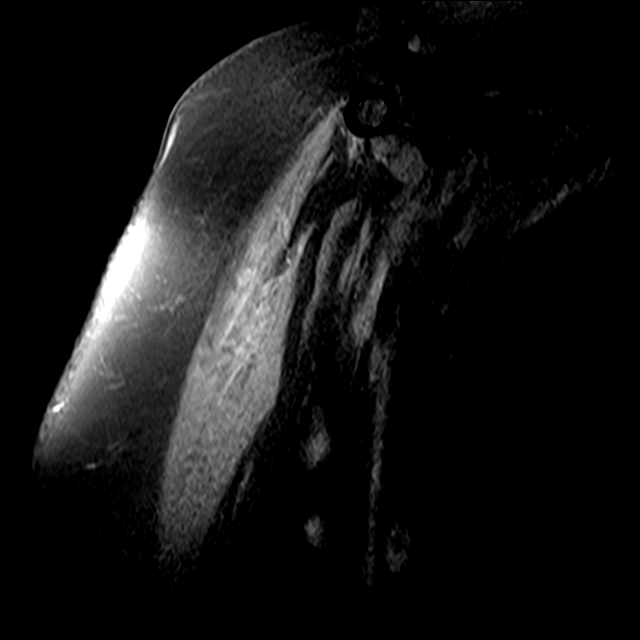
[im 3/18]
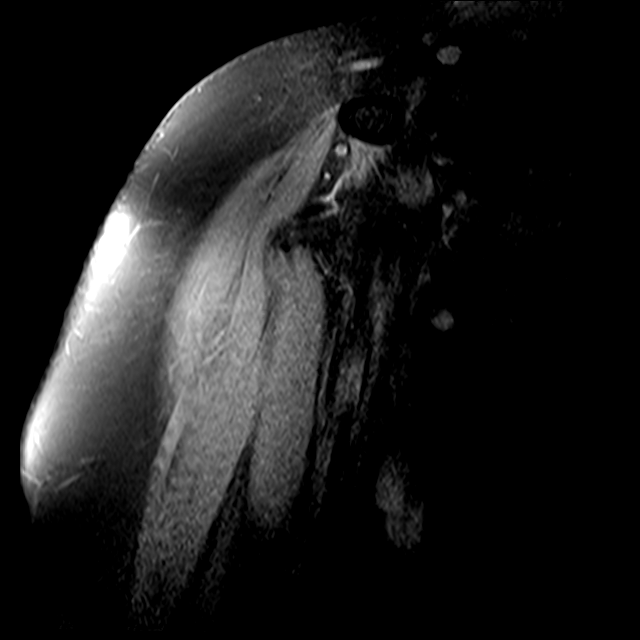
[im 10/18]
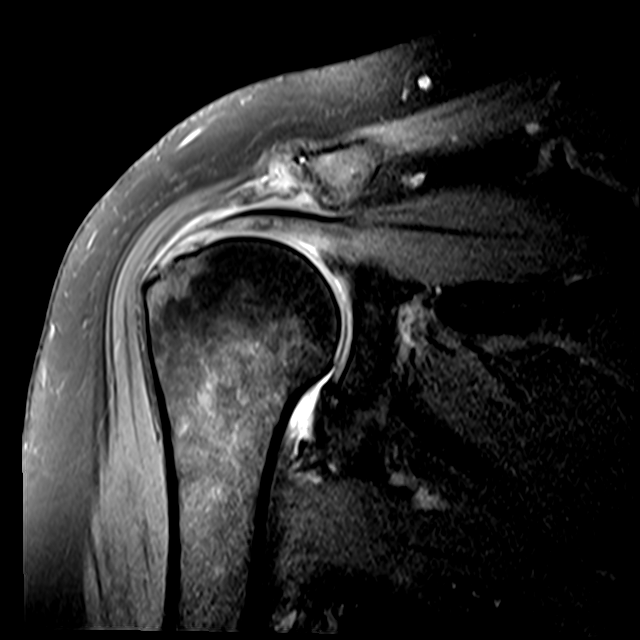
[im 15/18]
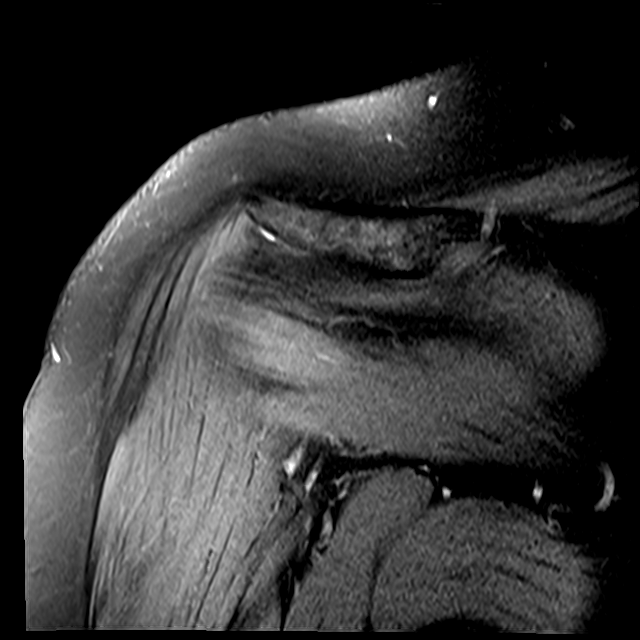

[Series 12: T2 fat-sat · oblique · right · 4.0mm · 0.23mm/px · 3 of 18 slices shown (2 of 2)]
[im 3/18]
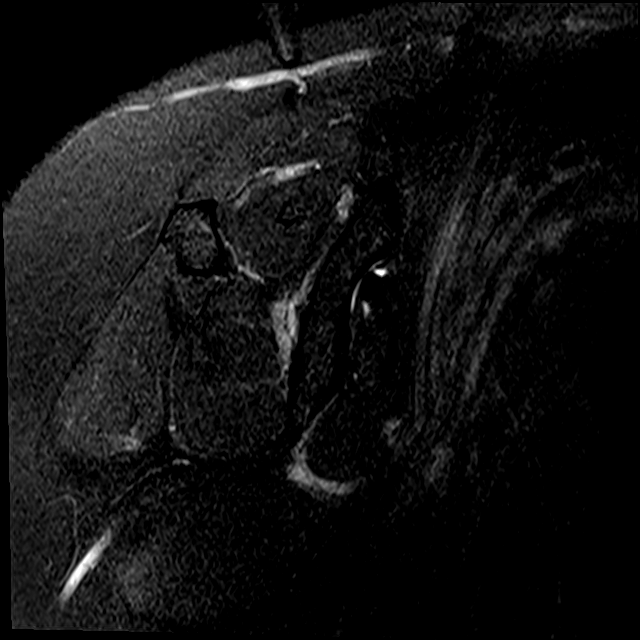
[im 10/18]
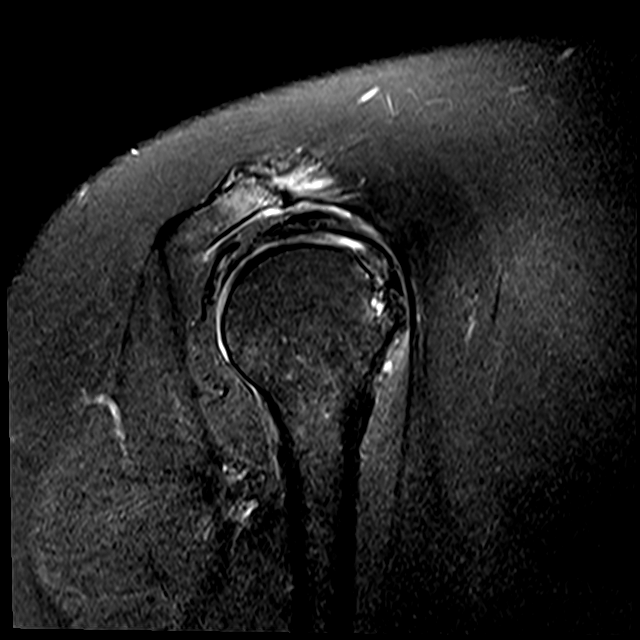
[im 15/18]
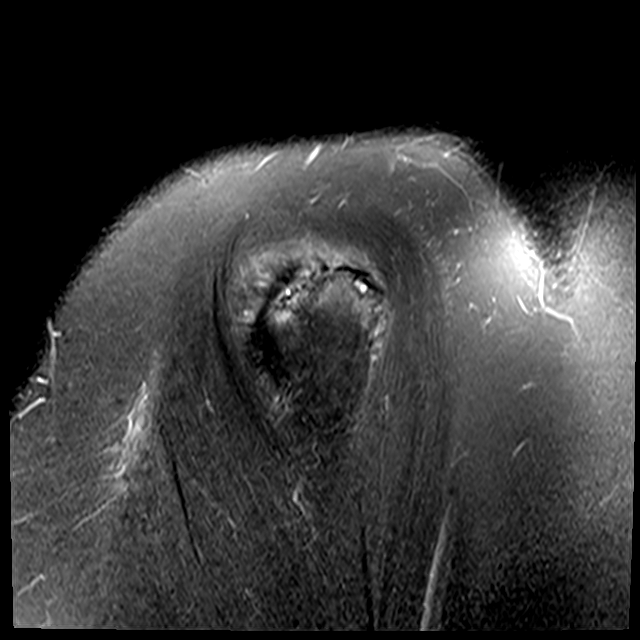

[18 of 40 positions shown; findings below may reference images not displayed]

FINDINGS: Rotator cuff: Intact. There is heterogeneously increased T2 signal
in all of the rotator cuff tendons consistent with tendinopathy.

Muscles:  No focal atrophy or lesion.

Biceps long head: There is marked tendinopathy of both the intra and
extra-articular segments. The intra-articular segment is attenuated
consistent with partial tearing. Longitudinal split tear is seen in
the extra-articular segment.

Acromioclavicular Joint: Moderate to moderately severe degenerative
change appears advanced for age. Type 1 acromion. There is a small
volume of fluid in the subacromial/subdeltoid bursa.

Glenohumeral Joint: Negative.

Labrum:  The posterior, superior labrum is degenerated and frayed.

Bones:  No fracture, contusion or worrisome lesion.

Other: None.
IMPRESSION: Marked tendinopathy of both the intra and extra-articular long head
of biceps. The intra-articular segment is severely attenuated
consistent with partial tearing and there is longitudinal split
tearing of the extra-articular segment in the bicipital groove.

Rotator cuff tendinopathy without tear.

Advanced for age appearing acromioclavicular osteoarthritis.

Degeneration and fraying of the posterior, superior labrum.

Small volume of subacromial/subdeltoid fluid consistent with
bursitis.

## 2022-11-09 ENCOUNTER — Other Ambulatory Visit: Payer: Self-pay | Admitting: Family Medicine

## 2022-11-09 DIAGNOSIS — Z1231 Encounter for screening mammogram for malignant neoplasm of breast: Secondary | ICD-10-CM
